# Patient Record
Sex: Female | Born: 1990 | Race: White | Hispanic: No | Marital: Single | Smoking: Former smoker
Health system: Southern US, Community
[De-identification: ages and names within clinical notes are randomized; demographics above are authoritative.]

## PROBLEM LIST (undated history)

## (undated) DIAGNOSIS — IMO0002 Reserved for concepts with insufficient information to code with codable children: Secondary | ICD-10-CM

## (undated) DIAGNOSIS — B009 Herpesviral infection, unspecified: Secondary | ICD-10-CM

## (undated) DIAGNOSIS — R87629 Unspecified abnormal cytological findings in specimens from vagina: Secondary | ICD-10-CM

## (undated) HISTORY — DX: Unspecified abnormal cytological findings in specimens from vagina: R87.629

## (undated) HISTORY — DX: Reserved for concepts with insufficient information to code with codable children: IMO0002

## (undated) HISTORY — PX: OTHER SURGICAL HISTORY: SHX169

## (undated) HISTORY — DX: Herpesviral infection, unspecified: B00.9

---
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
## 2008-05-23 ENCOUNTER — Emergency Department (HOSPITAL_COMMUNITY): Admission: EM | Admit: 2008-05-23 | Discharge: 2008-05-23 | Payer: Self-pay | Admitting: Emergency Medicine

## 2009-03-22 ENCOUNTER — Other Ambulatory Visit: Admission: RE | Admit: 2009-03-22 | Discharge: 2009-03-22 | Payer: Self-pay | Admitting: Obstetrics and Gynecology

## 2009-12-26 ENCOUNTER — Emergency Department (HOSPITAL_COMMUNITY): Admission: EM | Admit: 2009-12-26 | Discharge: 2009-12-27 | Payer: Self-pay | Admitting: Emergency Medicine

## 2010-04-13 ENCOUNTER — Emergency Department (HOSPITAL_COMMUNITY): Admission: EM | Admit: 2010-04-13 | Discharge: 2010-04-13 | Payer: Self-pay | Admitting: Emergency Medicine

## 2010-11-11 ENCOUNTER — Emergency Department (HOSPITAL_COMMUNITY)
Admission: EM | Admit: 2010-11-11 | Discharge: 2010-11-12 | Payer: Self-pay | Source: Home / Self Care | Admitting: Emergency Medicine

## 2011-02-01 LAB — DIFFERENTIAL
Basophils Absolute: 0.3 10*3/uL — ABNORMAL HIGH (ref 0.0–0.1)
Blasts: 0 %
Eosinophils Absolute: 0 10*3/uL (ref 0.0–0.7)
Lymphocytes Relative: 7 % — ABNORMAL LOW (ref 12–46)
Lymphs Abs: 1.9 10*3/uL (ref 0.7–4.0)
Monocytes Absolute: 0.8 10*3/uL (ref 0.1–1.0)
Neutro Abs: 24.1 10*3/uL — ABNORMAL HIGH (ref 1.7–7.7)
Neutrophils Relative %: 88 % — ABNORMAL HIGH (ref 43–77)

## 2011-02-01 LAB — URINALYSIS, ROUTINE W REFLEX MICROSCOPIC
Glucose, UA: NEGATIVE mg/dL
Hgb urine dipstick: NEGATIVE
Ketones, ur: NEGATIVE mg/dL
Protein, ur: NEGATIVE mg/dL
Specific Gravity, Urine: 1.015 (ref 1.005–1.030)
pH: 8.5 — ABNORMAL HIGH (ref 5.0–8.0)

## 2011-02-01 LAB — BASIC METABOLIC PANEL
CO2: 23 mEq/L (ref 19–32)
Calcium: 9.3 mg/dL (ref 8.4–10.5)
Creatinine, Ser: 0.76 mg/dL (ref 0.4–1.2)
GFR calc Af Amer: >60 mL/min (ref >60)
GFR calc non Af Amer: >60 mL/min (ref >60)
Glucose, Bld: 114 mg/dL — ABNORMAL HIGH (ref 70–99)
Potassium: 3.4 mEq/L — ABNORMAL LOW (ref 3.5–5.1)

## 2011-02-01 LAB — CBC
Hemoglobin: 12.3 g/dL (ref 12.0–15.0)
MCHC: 34.5 g/dL (ref 30.0–36.0)
WBC: 27.1 10*3/uL — ABNORMAL HIGH (ref 4.0–10.5)

## 2011-02-01 LAB — PREGNANCY, URINE: Preg Test, Ur: NEGATIVE

## 2011-08-23 LAB — URINALYSIS, ROUTINE W REFLEX MICROSCOPIC
Glucose, UA: NEGATIVE
Specific Gravity, Urine: 1.02

## 2011-08-23 LAB — CBC
HCT: 46.2
MCHC: 33.2
MCV: 86.5
Platelets: 264
RDW: 13.3

## 2011-08-23 LAB — BASIC METABOLIC PANEL
BUN: 11
GFR calc Af Amer: >60
GFR calc non Af Amer: >60
Glucose, Bld: 166 — ABNORMAL HIGH
Potassium: 4.4

## 2011-08-23 LAB — URINE MICROSCOPIC-ADD ON

## 2011-08-23 LAB — DIFFERENTIAL
Eosinophils Absolute: 0
Lymphs Abs: 0.6 — ABNORMAL LOW

## 2011-08-23 LAB — PREGNANCY, URINE: Preg Test, Ur: NEGATIVE

## 2011-08-23 IMAGING — CT CT ABD-PELV W/ CM
2 of 3 series · 16 of 46 positions shown, 18 images · IV contrast (Omnipaque 300)
Comparison: None.

CLINICAL DATA: Severe lower abdominal pain.

CT ABDOMEN AND PELVIS WITH CONTRAST
TECHNIQUE: Multidetector CT imaging of the abdomen and pelvis was
performed following the standard protocol during bolus
administration of intravenous contrast.
Contrast: 100 ml [id number]-PYY.

[Series 2: abd_pel_with 5.0 b40f · axial · 0.59mm/px · z∈[+326,+730]mm · 13 of 95 slices shown, 15 images]
[im 7/95  soft-tissue]
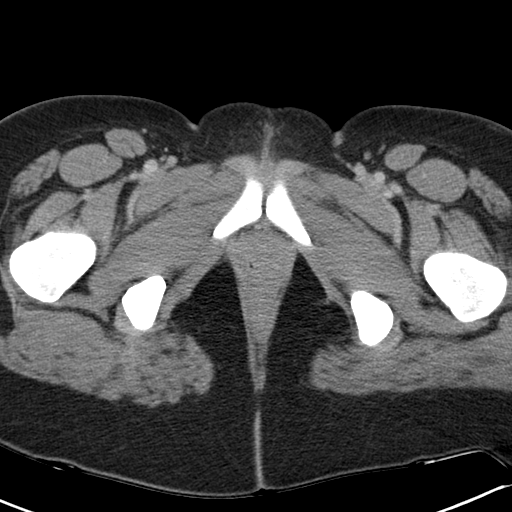
[im 7/95  bone]
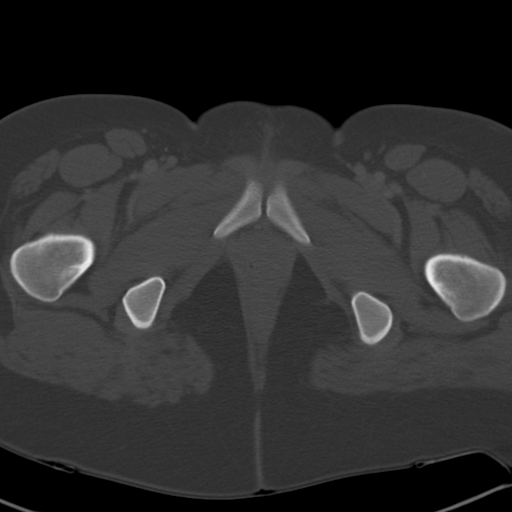
[im 13/95  soft-tissue]
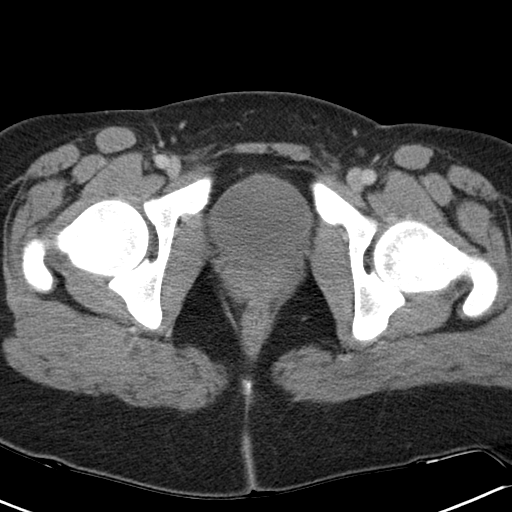
[im 19/95  soft-tissue]
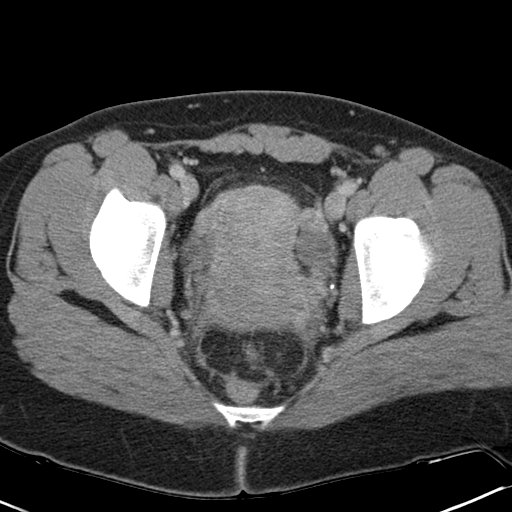
[im 28/95  soft-tissue]
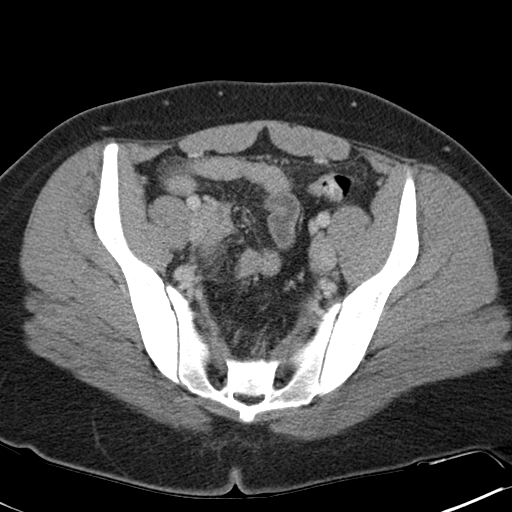
[im 34/95  soft-tissue]
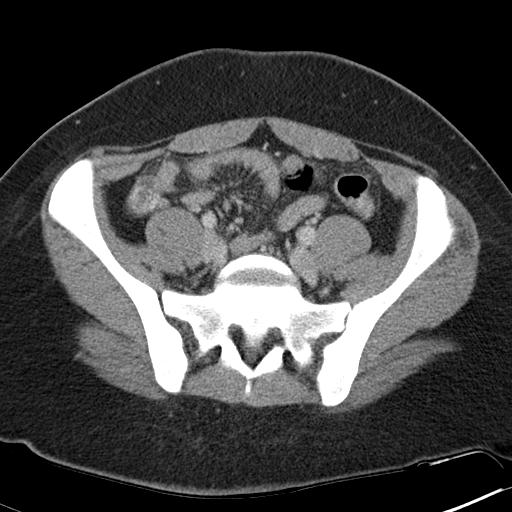
[im 40/95  soft-tissue]
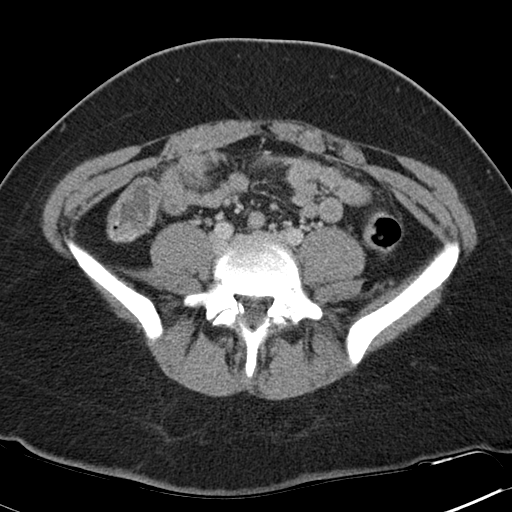
[im 49/95  soft-tissue]
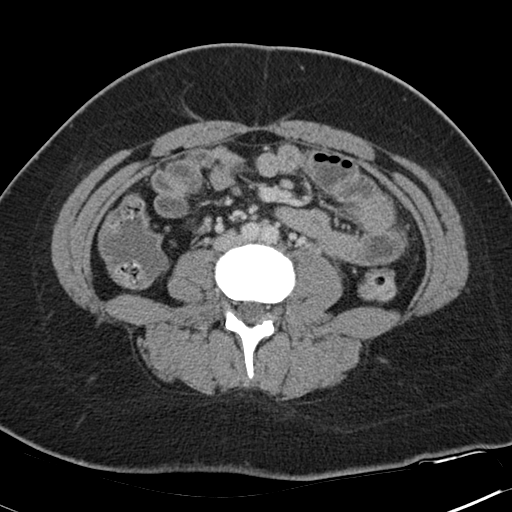
[im 55/95  soft-tissue]
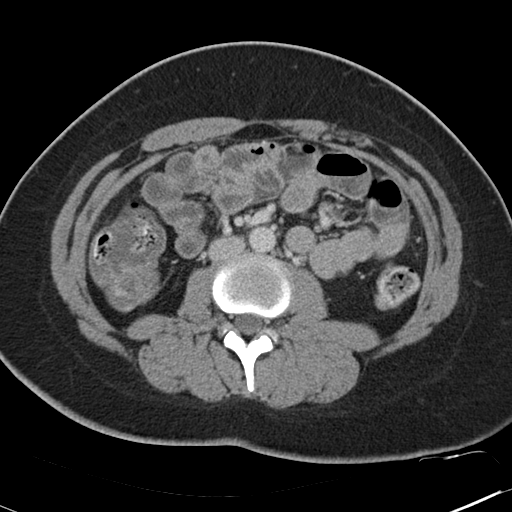
[im 61/95  soft-tissue]
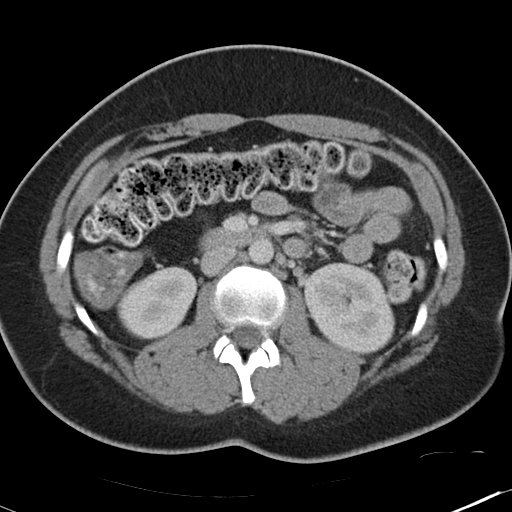
[im 61/95  bone]
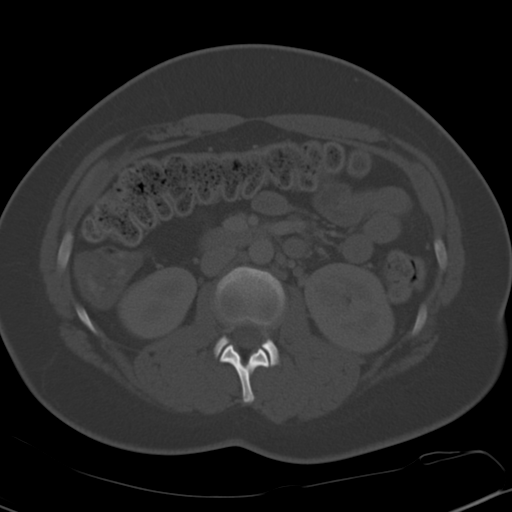
[im 67/95  soft-tissue]
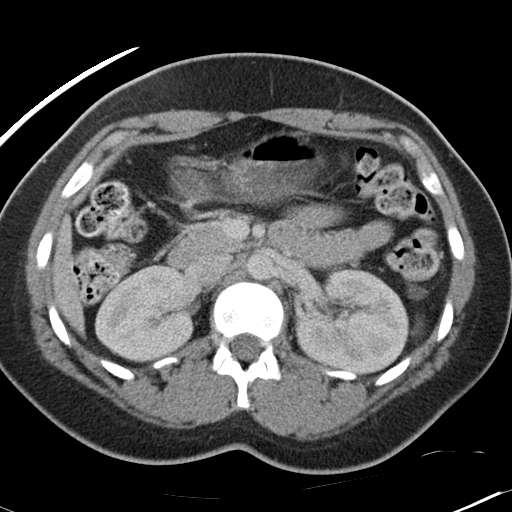
[im 76/95  soft-tissue]
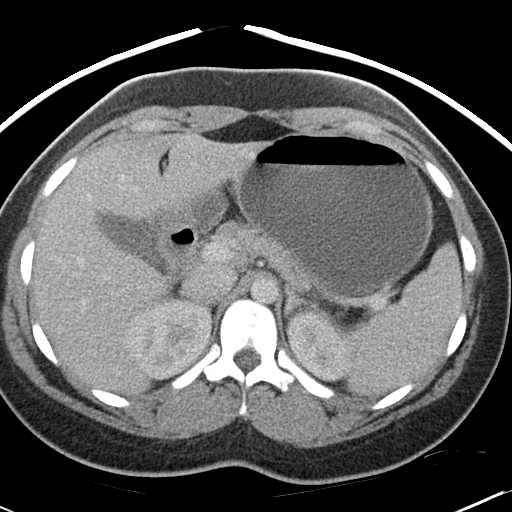
[im 82/95  soft-tissue]
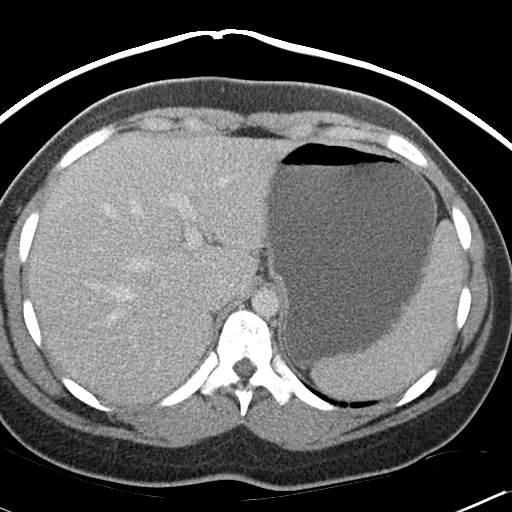
[im 88/95  soft-tissue]
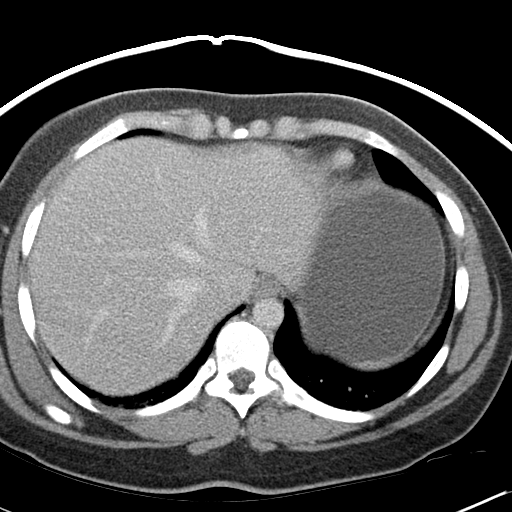

[Series 4: mpr cor post contrast (id) · coronal · 0.64mm/px · 3 of 96 slices shown]
[im 32/96  soft-tissue]
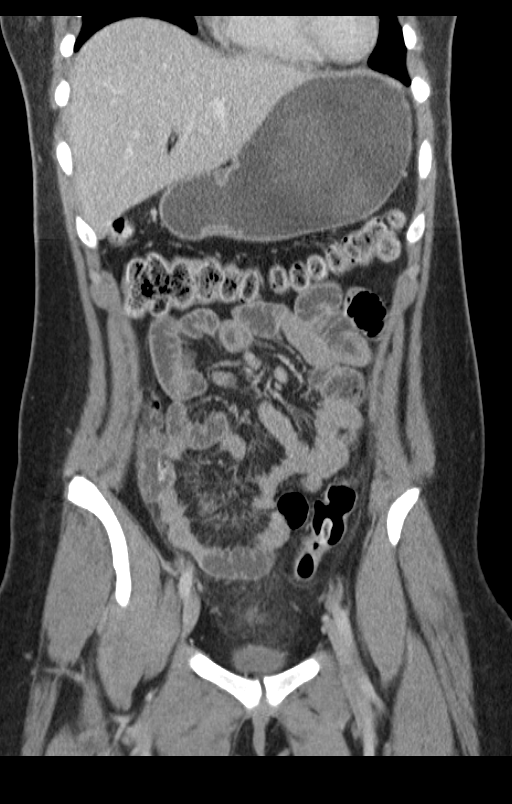
[im 43/96  soft-tissue]
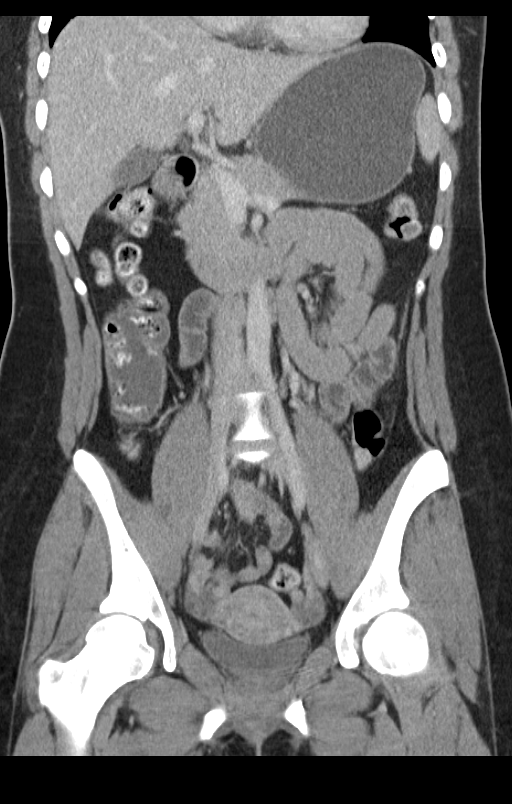
[im 53/96  soft-tissue]
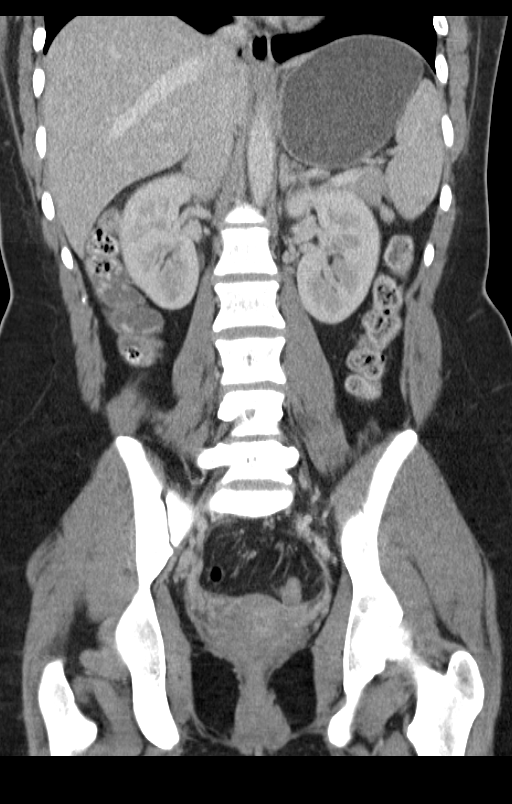

[16 of 46 positions shown; findings below may reference images not displayed]

FINDINGS: Lung bases show dependent atelectasis.  Liver and
gallbladder normal.  Common bile duct unremarkable.  Spleen normal.
Kidneys show normal enhancement.  Adrenal glands normal.  Stomach
and small bowel normal.  The stomach is distended with water.
Abdominal vasculature appears normal.

The appendix is unremarkable.  There is minimal fat stranding in
the anterior pelvic fat without cause identified.  This is
symmetric from left to right.  Uterus and adnexa appear within
normal limits.  Bilateral L5 pars defects with minimal grade 1
anterolisthesis of L5 on S1.  Colon and rectum appear within normal
limits.  Physiologic free fluid in the anatomic pelvis.
IMPRESSION: Minimal fat stranding in the anatomic pelvis without cause
identified.  This could be associated with enteric infection or
cystitis.

## 2011-08-24 ENCOUNTER — Emergency Department (HOSPITAL_COMMUNITY): Payer: Self-pay

## 2011-08-24 ENCOUNTER — Encounter: Payer: Self-pay | Admitting: Emergency Medicine

## 2011-08-24 ENCOUNTER — Emergency Department (HOSPITAL_COMMUNITY)
Admission: EM | Admit: 2011-08-24 | Discharge: 2011-08-24 | Disposition: A | Discharge status: Home / Self Care | Payer: Self-pay | Attending: Emergency Medicine | Admitting: Emergency Medicine

## 2011-08-24 DIAGNOSIS — B9789 Other viral agents as the cause of diseases classified elsewhere: Secondary | ICD-10-CM | POA: Insufficient documentation

## 2011-08-24 DIAGNOSIS — J45909 Unspecified asthma, uncomplicated: Secondary | ICD-10-CM | POA: Insufficient documentation

## 2011-08-24 DIAGNOSIS — B349 Viral infection, unspecified: Secondary | ICD-10-CM

## 2011-08-24 DIAGNOSIS — F17200 Nicotine dependence, unspecified, uncomplicated: Secondary | ICD-10-CM | POA: Insufficient documentation

## 2011-08-24 DIAGNOSIS — J40 Bronchitis, not specified as acute or chronic: Secondary | ICD-10-CM | POA: Insufficient documentation

## 2011-08-24 LAB — COMPREHENSIVE METABOLIC PANEL
Albumin: 3.5
Alkaline Phosphatase: 81
Chloride: 108
Creatinine, Ser: 0.7
GFR calc non Af Amer: >60
Potassium: 3.6

## 2011-08-24 LAB — POCT PREGNANCY, URINE: Preg Test, Ur: NEGATIVE

## 2011-08-24 MED ORDER — BENZONATATE 100 MG PO CAPS
200.0000 mg | ORAL_CAPSULE | Freq: Three times a day (TID) | ORAL | Status: DC
  Administered 2011-08-24: 200 mg via ORAL
  Filled 2011-08-24: qty 2

## 2011-08-24 MED ORDER — BENZONATATE 100 MG PO CAPS: 100.0000 mg | ORAL_CAPSULE | Freq: Three times a day (TID) | ORAL | Status: DC

## 2011-08-24 MED ORDER — BENZONATATE 100 MG PO CAPS: 100.0000 mg | ORAL_CAPSULE | Freq: Three times a day (TID) | ORAL | Status: AC

## 2011-08-24 NOTE — ED Provider Notes (Signed)
History    Scribed for [name], MD, the patient was seen in room APA05/APA05. This chart was scribed by Katha Cabal. This patient's care was started at 3:28 PM.     CSN: [id number] Arrival date & time: 08/24/2011  2:41 PM  Chief Complaint  Patient presents with  . Asthma  . Cough    (Consider location/radiation/quality/duration/timing/severity/associated sxs/prior treatment) HPI  ROSHANNA Henry is a 20 y.o. female with hx of asthma who presents to the Emergency Department complaining of sudden onset of SOB that began today with associated dry persistent cough, fever (undocumented), rhinorrhea and nasal congestion.  Denies nausea, vomiting and chills.   Patient does not use inhaler for asthma.  Patient adds that her boyfriend has similar sx.   Patient is a smoker.     PAST MEDICAL HISTORY:  Past Medical History  Diagnosis Date  . Asthma     PAST SURGICAL HISTORY:  History reviewed. No pertinent past surgical history.  FAMILY HISTORY:  No family history on file.   SOCIAL HISTORY: History   Social History  . Marital Status: Single    Spouse Name: N/A    Number of Children: N/A  . Years of Education: N/A   Social History Main Topics  . Smoking status: Current Everyday Smoker -- 0.5 packs/day    Types: Cigarettes  . Smokeless tobacco: None  . Alcohol Use: No  . Drug Use: No  . Sexually Active: Yes   Other Topics Concern  . None   Social History Narrative  . None    Review of Systems 10 Systems reviewed and are negative for acute change except as noted in the HPI.  Allergies  Penicillins  Home Medications   Current Outpatient Rx  Name Route Sig Dispense Refill  . SINUS CONGESTION/PAIN SEVERE PO Oral Take 2 tablets by mouth once as needed. For sinus congestion relief     . IMPLANON [place] Subcutaneous Inject 1 each into the skin once.        BP 114/66  Pulse 93  Temp(Src) 99.6 F (37.6 C) (Oral)  Resp 26  Ht 5\' 4"  (1.626 m)  Wt 175 lb  (79.379 kg)  BMI 30.04 kg/m2  SpO2 100%  LMP 07/25/2011  Physical Exam  Constitutional: She is oriented to person, place, and time. She appears well-developed and well-nourished.  HENT:  Head: Normocephalic and atraumatic.  Nose: Right sinus exhibits no maxillary sinus tenderness and no frontal sinus tenderness. Left sinus exhibits no maxillary sinus tenderness and no frontal sinus tenderness.  Mouth/Throat: Uvula is midline, oropharynx is clear and moist and mucous membranes are normal.  Eyes: EOM are normal.  Neck: Neck supple.  Cardiovascular: Normal rate, regular rhythm, normal heart sounds and intact distal pulses.   Pulmonary/Chest: Effort normal and breath sounds normal. No respiratory distress. She has no wheezes. She has no rales.       Clear and equal bilaterally   Abdominal: Soft. There is no tenderness.  Musculoskeletal: Normal range of motion.  Neurological: She is alert and oriented to person, place, and time.  Skin: Skin is warm. No rash noted.  Psychiatric: She has a normal mood and affect. Her behavior is normal.    ED Course  Procedures (including critical care time)   OTHER DATA REVIEWED: Nursing notes, vital signs, and past medical records reviewed.   DIAGNOSTIC STUDIES: Oxygen Saturation is 100% on room air, normal by my interpretation.       LABS / RADIOLOGY:  Labs Reviewed  POCT PREGNANCY, URINE  POCT PREGNANCY, URINE   Dg Chest 2 View  08/24/2011  *RADIOLOGY REPORT*  Clinical Data: Cough, shortness of breath, asthma, smoker  CHEST - 2 VIEW  Comparison: 12/26/2009  Findings: Normal heart size, mediastinal contours, and pulmonary vascularity. Lungs clear. No pleural effusion or pneumothorax. Bones unremarkable.  IMPRESSION: Normal exam.  Original Report Authenticated By: Lollie Marrow, M.D.     ED COURSE / COORDINATION OF CARE: 3:35 Physical exam complete.  Will order CXR.    Orders Placed This Encounter  Procedures  . DG Chest 2 View  .  Pregnancy, urine POC      MEDICATIONS GIVEN IN THE E.D. Scheduled Meds:   Continuous Infusions:       Marland Kitchen  MDM  MDM:  Asthma exacerbation, versus viral illness.  Bronchitis versus pneumonia IMPRESSION: Viral infection No respiratory distress hypoxia or signs of toxicity.     I personally performed the services described in this documentation, which was scribed in my presence. The recorded information has been reviewed and considered.          [name], MD 08/24/11 1756

## 2011-08-24 NOTE — ED Notes (Signed)
Pt taken to xray now. u preg negative

## 2011-08-24 NOTE — ED Notes (Signed)
Pt states having coughing and SOB starting today. ptstates has history of asthma

## 2011-08-29 LAB — URINALYSIS, ROUTINE W REFLEX MICROSCOPIC
Glucose, UA: NEGATIVE
Nitrite: NEGATIVE
Protein, ur: NEGATIVE
Urobilinogen, UA: 0.2

## 2011-08-29 LAB — PREGNANCY, URINE: Preg Test, Ur: NEGATIVE

## 2011-09-12 ENCOUNTER — Other Ambulatory Visit: Payer: Self-pay | Admitting: Adult Health

## 2011-09-12 ENCOUNTER — Other Ambulatory Visit (HOSPITAL_COMMUNITY)
Admission: RE | Admit: 2011-09-12 | Discharge: 2011-09-12 | Disposition: A | Discharge status: Home / Self Care | Payer: Medicaid Other | Source: Ambulatory Visit | Attending: Obstetrics and Gynecology | Admitting: Obstetrics and Gynecology

## 2011-09-12 DIAGNOSIS — Z113 Encounter for screening for infections with a predominantly sexual mode of transmission: Secondary | ICD-10-CM | POA: Insufficient documentation

## 2011-09-12 DIAGNOSIS — Z01419 Encounter for gynecological examination (general) (routine) without abnormal findings: Secondary | ICD-10-CM | POA: Insufficient documentation

## 2012-09-23 ENCOUNTER — Other Ambulatory Visit (HOSPITAL_COMMUNITY)
Admission: RE | Admit: 2012-09-23 | Discharge: 2012-09-23 | Disposition: A | Discharge status: Home / Self Care | Payer: Medicaid Other | Source: Ambulatory Visit | Attending: Obstetrics and Gynecology | Admitting: Obstetrics and Gynecology

## 2012-09-23 ENCOUNTER — Other Ambulatory Visit: Payer: Self-pay | Admitting: Adult Health

## 2012-09-23 DIAGNOSIS — Z113 Encounter for screening for infections with a predominantly sexual mode of transmission: Secondary | ICD-10-CM | POA: Insufficient documentation

## 2012-09-23 DIAGNOSIS — Z01419 Encounter for gynecological examination (general) (routine) without abnormal findings: Secondary | ICD-10-CM | POA: Insufficient documentation

## 2012-11-21 ENCOUNTER — Encounter (HOSPITAL_COMMUNITY): Payer: Self-pay | Admitting: *Deleted

## 2012-11-21 ENCOUNTER — Emergency Department (HOSPITAL_COMMUNITY)
Admission: EM | Admit: 2012-11-21 | Discharge: 2012-11-22 | Disposition: A | Discharge status: Home / Self Care | Payer: Medicaid Other | Attending: Emergency Medicine | Admitting: Emergency Medicine

## 2012-11-21 DIAGNOSIS — R05 Cough: Secondary | ICD-10-CM

## 2012-11-21 DIAGNOSIS — J3489 Other specified disorders of nose and nasal sinuses: Secondary | ICD-10-CM | POA: Insufficient documentation

## 2012-11-21 DIAGNOSIS — J45909 Unspecified asthma, uncomplicated: Secondary | ICD-10-CM | POA: Insufficient documentation

## 2012-11-21 DIAGNOSIS — R059 Cough, unspecified: Secondary | ICD-10-CM | POA: Insufficient documentation

## 2012-11-21 DIAGNOSIS — IMO0002 Reserved for concepts with insufficient information to code with codable children: Secondary | ICD-10-CM | POA: Insufficient documentation

## 2012-11-21 DIAGNOSIS — R509 Fever, unspecified: Secondary | ICD-10-CM | POA: Insufficient documentation

## 2012-11-21 DIAGNOSIS — F17200 Nicotine dependence, unspecified, uncomplicated: Secondary | ICD-10-CM | POA: Insufficient documentation

## 2012-11-21 DIAGNOSIS — H6692 Otitis media, unspecified, left ear: Secondary | ICD-10-CM

## 2012-11-21 DIAGNOSIS — H6690 Otitis media, unspecified, unspecified ear: Secondary | ICD-10-CM | POA: Insufficient documentation

## 2012-11-21 DIAGNOSIS — H9209 Otalgia, unspecified ear: Secondary | ICD-10-CM | POA: Insufficient documentation

## 2012-11-21 DIAGNOSIS — J029 Acute pharyngitis, unspecified: Secondary | ICD-10-CM

## 2012-11-21 MED ORDER — CEFTRIAXONE SODIUM 1 G IJ SOLR
1.0000 g | Freq: Once | INTRAMUSCULAR | Status: AC
  Administered 2012-11-22: 1 g via INTRAMUSCULAR
  Filled 2012-11-21: qty 10

## 2012-11-21 MED ORDER — AZITHROMYCIN 250 MG PO TABS
500.0000 mg | ORAL_TABLET | Freq: Once | ORAL | Status: AC
  Administered 2012-11-22: 500 mg via ORAL
  Filled 2012-11-21: qty 2

## 2012-11-21 MED ORDER — HYDROCOD POLST-CHLORPHEN POLST 10-8 MG/5ML PO LQCR
5.0000 mL | Freq: Once | ORAL | Status: AC
  Administered 2012-11-22: 5 mL via ORAL
  Filled 2012-11-21: qty 5

## 2012-11-21 NOTE — ED Notes (Signed)
Cough, fever, nasal congestion for 1 week.  Nausea.

## 2012-11-21 NOTE — ED Provider Notes (Signed)
History     CSN: [id number]  Arrival date & time 11/21/12  2233   First MD Initiated Contact with Patient 11/21/12 2328      Chief Complaint  Patient presents with  . Cough    (Consider location/radiation/quality/duration/timing/severity/associated sxs/prior treatment) HPI Comments: Cough is NP.  Pt prefers not to get a CXR b/c of cost.  She is in process of getting insurance.  Patient is a 22 y.o. female presenting with cough. The history is provided by the patient. No language interpreter was used.  Cough This is a new problem. Episode onset: ~ 12 days ago. The problem occurs constantly. The problem has not changed since onset.The maximum temperature recorded prior to her arrival was 100 to 100.9 F. Associated symptoms include ear congestion, ear pain, headaches, rhinorrhea and sore throat. Pertinent negatives include no chills. She has tried nothing for the symptoms. She is a smoker. Her past medical history is significant for asthma.    Past Medical History  Diagnosis Date  . Asthma     History reviewed. No pertinent past surgical history.  History reviewed. No pertinent family history.  History  Substance Use Topics  . Smoking status: Current Every Day Smoker -- 0.5 packs/day    Types: Cigarettes  . Smokeless tobacco: Not on file  . Alcohol Use: No    OB History    Grav Para Term Preterm Abortions TAB SAB Ect Mult Living                  Review of Systems  Constitutional: Positive for fever. Negative for chills.  HENT: Positive for ear pain, sore throat and rhinorrhea.   Respiratory: Positive for cough.   Neurological: Positive for headaches.  All other systems reviewed and are negative.    Allergies  Penicillins  Home Medications   Current Outpatient Rx  Name  Route  Sig  Dispense  Refill  . AZITHROMYCIN 250 MG PO TABS      One tab po QD (intial dose given in the ED)   4 tablet   0   . IMPLANON [place]   Subcutaneous   Inject 1 each into the skin  once.           Marland Kitchen FLUTICASONE PROPIONATE 50 MCG/ACT NA SUSP   Nasal   Place 2 sprays into the nose daily.   16 g   0   . GUAIFENESIN-CODEINE 100-10 MG/5ML PO SYRP      10 mls po q 4-6 hrs prn cough   240 mL   0   . SINUS CONGESTION/PAIN SEVERE PO   Oral   Take 2 tablets by mouth once as needed. For sinus congestion relief            BP 121/89  Pulse 76  Temp 98.4 F (36.9 C) (Oral)  Resp 24  Ht 5' 4.5" (1.638 m)  Wt 175 lb (79.379 kg)  BMI 29.57 kg/m2  SpO2 100%  LMP 10/21/2012  Physical Exam  Nursing note and vitals reviewed. Constitutional: She is oriented to person, place, and time. She appears well-developed and well-nourished. No distress.  HENT:  Head: Normocephalic and atraumatic.  Right Ear: Hearing, tympanic membrane, external ear and ear canal normal.  Left Ear: External ear and ear canal normal. Tympanic membrane is erythematous and bulging. A middle ear effusion is present.  Mouth/Throat: Uvula is midline and mucous membranes are normal. Posterior oropharyngeal erythema present. No oropharyngeal exudate, posterior oropharyngeal edema or tonsillar abscesses.  Eyes: EOM are normal.  Neck: Normal range of motion.  Cardiovascular: Normal rate and regular rhythm.   Pulmonary/Chest: Effort normal and breath sounds normal. No accessory muscle usage. Not tachypneic. No respiratory distress. She has no decreased breath sounds. She has no wheezes. She has no rhonchi. She has no rales. She exhibits no tenderness.  Abdominal: Soft. She exhibits no distension. There is no tenderness.  Musculoskeletal: Normal range of motion.  Neurological: She is alert and oriented to person, place, and time.  Skin: Skin is warm and dry.  Psychiatric: She has a normal mood and affect. Judgment normal.    ED Course  Procedures (including critical care time)  Labs Reviewed - No data to display No results found.   1. Left otitis media   2. Pharyngitis   3. Cough        MDM  rx-zithromax 250 mg x 4 rx-flonase spray rx-robitussin AC, 240 ml  Rocephin 1 gm IM and zithromax 500 mg po given in ED.        Evalina Field, PA 11/22/12 0010  Evalina Field, PA 11/22/12 0010

## 2012-11-22 MED ORDER — AZITHROMYCIN 250 MG PO TABS: ORAL_TABLET | ORAL | Status: DC

## 2012-11-22 MED ORDER — GUAIFENESIN-CODEINE 100-10 MG/5ML PO SYRP: ORAL_SOLUTION | ORAL | Status: DC

## 2012-11-22 MED ORDER — LIDOCAINE HCL (PF) 1 % IJ SOLN
INTRAMUSCULAR | Status: AC
  Administered 2012-11-22: 01:00:00
  Filled 2012-11-22: qty 5

## 2012-11-22 MED ORDER — FLUTICASONE PROPIONATE 50 MCG/ACT NA SUSP: 2.0000 | Freq: Every day | NASAL | Status: DC

## 2012-11-22 NOTE — ED Notes (Signed)
Discharge instructions reviewed with pt, questions answered. Pt verbalized understanding.  

## 2012-11-23 NOTE — ED Provider Notes (Signed)
Medical screening examination/treatment/procedure(s) were performed by non-physician practitioner and as supervising physician I was immediately available for consultation/collaboration.   Joya Gaskins, MD 11/23/12 [phone]

## 2013-03-28 ENCOUNTER — Encounter (HOSPITAL_COMMUNITY): Payer: Self-pay | Admitting: *Deleted

## 2013-03-28 ENCOUNTER — Emergency Department (HOSPITAL_COMMUNITY): Payer: Self-pay

## 2013-03-28 ENCOUNTER — Emergency Department (HOSPITAL_COMMUNITY)
Admission: EM | Admit: 2013-03-28 | Discharge: 2013-03-28 | Disposition: A | Discharge status: Home / Self Care | Payer: Self-pay | Attending: Emergency Medicine | Admitting: Emergency Medicine

## 2013-03-28 DIAGNOSIS — Z3202 Encounter for pregnancy test, result negative: Secondary | ICD-10-CM | POA: Insufficient documentation

## 2013-03-28 DIAGNOSIS — J45909 Unspecified asthma, uncomplicated: Secondary | ICD-10-CM | POA: Insufficient documentation

## 2013-03-28 DIAGNOSIS — Z79899 Other long term (current) drug therapy: Secondary | ICD-10-CM | POA: Insufficient documentation

## 2013-03-28 DIAGNOSIS — R110 Nausea: Secondary | ICD-10-CM | POA: Insufficient documentation

## 2013-03-28 DIAGNOSIS — Z880 Allergy status to penicillin: Secondary | ICD-10-CM | POA: Insufficient documentation

## 2013-03-28 DIAGNOSIS — IMO0002 Reserved for concepts with insufficient information to code with codable children: Secondary | ICD-10-CM | POA: Insufficient documentation

## 2013-03-28 DIAGNOSIS — R109 Unspecified abdominal pain: Secondary | ICD-10-CM | POA: Insufficient documentation

## 2013-03-28 DIAGNOSIS — F17200 Nicotine dependence, unspecified, uncomplicated: Secondary | ICD-10-CM | POA: Insufficient documentation

## 2013-03-28 LAB — CBC WITH DIFFERENTIAL/PLATELET
Eosinophils Absolute: 0.1 10*3/uL (ref 0.0–0.7)
Eosinophils Relative: 1 % (ref 0–5)
Hemoglobin: 13 g/dL (ref 12.0–15.0)
Lymphocytes Relative: 7 % — ABNORMAL LOW (ref 12–46)
Lymphs Abs: 0.8 10*3/uL (ref 0.7–4.0)
MCH: 30.7 pg (ref 26.0–34.0)
MCV: 90.6 fL (ref 78.0–100.0)
Monocytes Relative: 5 % (ref 3–12)
RBC: 4.24 MIL/uL (ref 3.87–5.11)

## 2013-03-28 LAB — COMPREHENSIVE METABOLIC PANEL
AST: 14 U/L (ref 0–37)
Albumin: 4.1 g/dL (ref 3.5–5.2)
Calcium: 9.2 mg/dL (ref 8.4–10.5)
Creatinine, Ser: 0.66 mg/dL (ref 0.50–1.10)
Total Protein: 7.3 g/dL (ref 6.0–8.3)

## 2013-03-28 LAB — URINALYSIS, ROUTINE W REFLEX MICROSCOPIC
Glucose, UA: NEGATIVE mg/dL
Ketones, ur: 40 mg/dL — AB
Protein, ur: NEGATIVE mg/dL
pH: 6 (ref 5.0–8.0)

## 2013-03-28 LAB — LIPASE, BLOOD: Lipase: 20 U/L (ref 11–59)

## 2013-03-28 LAB — URINE MICROSCOPIC-ADD ON

## 2013-03-28 LAB — AMYLASE: Amylase: 28 U/L (ref 0–105)

## 2013-03-28 LAB — POCT PREGNANCY, URINE: Preg Test, Ur: NEGATIVE

## 2013-03-28 LAB — WET PREP, GENITAL: Clue Cells Wet Prep HPF POC: NONE SEEN

## 2013-03-28 MED ORDER — PROMETHAZINE HCL 25 MG PO TABS: 25.0000 mg | ORAL_TABLET | Freq: Four times a day (QID) | ORAL | Status: DC | PRN

## 2013-03-28 MED ORDER — OXYCODONE-ACETAMINOPHEN 5-325 MG PO TABS: 1.0000 | ORAL_TABLET | Freq: Four times a day (QID) | ORAL | Status: DC | PRN

## 2013-03-28 MED ORDER — OXYCODONE-ACETAMINOPHEN 5-325 MG PO TABS
2.0000 | ORAL_TABLET | Freq: Once | ORAL | Status: AC
  Administered 2013-03-28: 2 via ORAL
  Filled 2013-03-28: qty 2

## 2013-03-28 MED ORDER — ONDANSETRON 4 MG PO TBDP
4.0000 mg | ORAL_TABLET | Freq: Once | ORAL | Status: AC
  Administered 2013-03-28: 4 mg via ORAL
  Filled 2013-03-28: qty 1

## 2013-03-28 NOTE — ED Provider Notes (Signed)
History    This chart was scribed for Angelica B. Bernette Mayers, MD by Toya Smothers, ED Scribe. The patient was seen in room APA11/APA11. Patient's care was started at 1216.  CSN: [id number]  Arrival date & time 03/28/13  1216   None     Chief Complaint  Patient presents with  . Abdominal Pain    Patient is a 22 y.o. female presenting with abdominal pain. The history is provided by the patient. No language interpreter was used.  Abdominal Pain Associated symptoms: nausea     HPI Comments:  Angelica Henry is a 23 y.o. female who presents to the Emergency Department complaining of 2 weeks of intermittent, abdominal pain without aggravation. Pain is dull, non-radiating, worse at night, and improved with pressure. She endorses nausea without emesis, and reports no change when eating. Pt had short menses last week No bleeding now. Pt reports regular bowel movements. Symptoms have not been treated PTA. Pt denies vaginal bleeding, vaginal discharge, hematuria, fever, chills, diarrhea, weakness, cough, SOB and any other pain. Pt is a current everyday smoker, denying alcohol and illicit drug use. LKMP 03/20/13. Per Pt, there is no chance that she could be pregnant.    Past Medical History  Diagnosis Date  . Asthma     History reviewed. No pertinent past surgical history.  History reviewed. No pertinent family history.  History  Substance Use Topics  . Smoking status: Current Every Day Smoker -- 0.50 packs/day    Types: Cigarettes  . Smokeless tobacco: Not on file  . Alcohol Use: No   Review of Systems  Gastrointestinal: Positive for nausea and abdominal pain.  All other systems reviewed and are negative.    Allergies  Penicillins  Home Medications   Current Outpatient Rx  Name  Route  Sig  Dispense  Refill  . azithromycin (ZITHROMAX) 250 MG tablet      One tab po QD (intial dose given in the ED)   4 tablet   0   . Etonogestrel (IMPLANON [place])   Subcutaneous   Inject 1 each into  the skin once.           . fluticasone (FLONASE) 50 MCG/ACT nasal spray   Nasal   Place 2 sprays into the nose daily.   16 g   0   . guaiFENesin-codeine (ROBITUSSIN AC) 100-10 MG/5ML syrup      10 mls po q 4-6 hrs prn cough   240 mL   0   . Phenylephrine-APAP-Guaifenesin (SINUS CONGESTION/PAIN SEVERE PO)   Oral   Take 2 tablets by mouth once as needed. For sinus congestion relief            BP 109/54  Pulse 82  Temp(Src) 98.6 F (37 C) (Oral)  Resp 20  Ht 5\' 4"  (1.626 m)  Wt 180 lb (81.647 kg)  BMI 30.88 kg/m2  SpO2 100%  LMP 03/21/2013  Physical Exam  Nursing note and vitals reviewed. Constitutional: She is oriented to person, place, and time. She appears well-developed and well-nourished.  HENT:  Head: Normocephalic and atraumatic.  Eyes: EOM are normal. Pupils are equal, round, and reactive to light.  Neck: Normal range of motion. Neck supple.  Cardiovascular: Normal rate, normal heart sounds and intact distal pulses.   Pulmonary/Chest: Effort normal and breath sounds normal.  Abdominal: Bowel sounds are normal. She exhibits no distension. There is no tenderness.  Genitourinary: Cervix exhibits motion tenderness. Cervix exhibits no discharge and no friability. Right adnexum  displays tenderness (mild). Right adnexum displays no mass. Left adnexum displays no mass and no tenderness. No bleeding around the vagina. No vaginal discharge found.  Musculoskeletal: Normal range of motion. She exhibits no edema and no tenderness.  Neurological: She is alert and oriented to person, place, and time. She has normal strength. No cranial nerve deficit or sensory deficit.  Skin: Skin is warm and dry. No rash noted.  Psychiatric: She has a normal mood and affect.    ED Course  Procedures DIAGNOSTIC STUDIES: Oxygen Saturation is 100% on room air, normal by my interpretation.    COORDINATION OF CARE: 15:40- Evaluated Pt. Pt is awake, alert, and without distress. Pt appears  to be in pain. 15:44- Patient / Family / Caregiver understand and agree with initial ED impression and plan with expectations set for ED visit.     Labs Reviewed  WET PREP, GENITAL - Abnormal; Notable for the following:    WBC, Wet Prep HPF POC FEW (*)    All other components within normal limits  CBC WITH DIFFERENTIAL - Abnormal; Notable for the following:    Neutrophils Relative % 86 (*)    Neutro Abs 8.8 (*)    Lymphocytes Relative 7 (*)    All other components within normal limits  URINALYSIS, ROUTINE W REFLEX MICROSCOPIC - Abnormal; Notable for the following:    Specific Gravity, Urine >1.030 (*)    Hgb urine dipstick TRACE (*)    Ketones, ur 40 (*)    All other components within normal limits  GC/CHLAMYDIA PROBE AMP  AMYLASE  COMPREHENSIVE METABOLIC PANEL  URINE MICROSCOPIC-ADD ON  LIPASE, BLOOD  POCT PREGNANCY, URINE   US Transvaginal Non-ob  03/28/2013   *RADIOLOGY REPORT*  Clinical Data: Pelvic pain.  TRANSABDOMINAL AND TRANSVAGINAL ULTRASOUND OF PELVIS Technique:  Both transabdominal and transvaginal ultrasound examinations of the pelvis were performed. Transabdominal technique was performed for global imaging of the pelvis including uterus, ovaries, adnexal regions, and pelvic cul-de-sac.  It was necessary to proceed with endovaginal exam following the transabdominal exam to visualize the uterus, ovaries, and adnexa  .  Comparison:  12/27/2009 CT  Findings:  Uterus: 6.5 x 3.2 x 4.2 cm. Normal in morphology.  Endometrium: Normal, 3 mm.  Right ovary:  Normal appearance/no adnexal mass  A cylindrical structure in the right pelvis on image 73 is favored to represent bowel, adjacent to the ovary.  Left ovary: Normal appearance/no adnexal mass  Other findings: No free fluid  IMPRESSION: Normal pelvic ultrasound for age.  No explanation for pain.  A cylindrical structure adjacent the right ovary is favored to represent bowel.  If there is a clinical concern of hydrosalpinx (which is  felt less likely), consider CT or MRI.   Original Report Authenticated By: Jeronimo Greaves, M.D.   US Pelvis Complete  03/28/2013   *RADIOLOGY REPORT*  Clinical Data: Pelvic pain.  TRANSABDOMINAL AND TRANSVAGINAL ULTRASOUND OF PELVIS Technique:  Both transabdominal and transvaginal ultrasound examinations of the pelvis were performed. Transabdominal technique was performed for global imaging of the pelvis including uterus, ovaries, adnexal regions, and pelvic cul-de-sac.  It was necessary to proceed with endovaginal exam following the transabdominal exam to visualize the uterus, ovaries, and adnexa  .  Comparison:  12/27/2009 CT  Findings:  Uterus: 6.5 x 3.2 x 4.2 cm. Normal in morphology.  Endometrium: Normal, 3 mm.  Right ovary:  Normal appearance/no adnexal mass  A cylindrical structure in the right pelvis on image 73 is favored to represent  bowel, adjacent to the ovary.  Left ovary: Normal appearance/no adnexal mass  Other findings: No free fluid  IMPRESSION: Normal pelvic ultrasound for age.  No explanation for pain.  A cylindrical structure adjacent the right ovary is favored to represent bowel.  If there is a clinical concern of hydrosalpinx (which is felt less likely), consider CT or MRI.   Original Report Authenticated By: Jeronimo Greaves, M.D.     1. Abdominal pain       MDM  Labs and ultrasound unremarkable as above, agree with radiologist; Doubt she has hydrosalpinx, pain improved. Advised follow up with Gyn for further eval if symptoms persist.       I personally performed the services described in this documentation, which was scribed in my presence. The recorded information has been reviewed and is accurate.     Angelica B. Bernette Mayers, MD 03/28/13 1723

## 2013-03-28 NOTE — ED Notes (Signed)
abd pain for 2 weeks,nausea, no vomiting, no diarrhea

## 2013-10-15 ENCOUNTER — Encounter (HOSPITAL_COMMUNITY): Payer: Self-pay | Admitting: Emergency Medicine

## 2013-10-15 ENCOUNTER — Emergency Department (HOSPITAL_COMMUNITY)
Admission: EM | Admit: 2013-10-15 | Discharge: 2013-10-15 | Disposition: A | Discharge status: Home / Self Care | Payer: Self-pay | Attending: Emergency Medicine | Admitting: Emergency Medicine

## 2013-10-15 DIAGNOSIS — Y929 Unspecified place or not applicable: Secondary | ICD-10-CM | POA: Insufficient documentation

## 2013-10-15 DIAGNOSIS — S335XXA Sprain of ligaments of lumbar spine, initial encounter: Secondary | ICD-10-CM | POA: Insufficient documentation

## 2013-10-15 DIAGNOSIS — S39012A Strain of muscle, fascia and tendon of lower back, initial encounter: Secondary | ICD-10-CM

## 2013-10-15 DIAGNOSIS — Y939 Activity, unspecified: Secondary | ICD-10-CM | POA: Insufficient documentation

## 2013-10-15 DIAGNOSIS — Z880 Allergy status to penicillin: Secondary | ICD-10-CM | POA: Insufficient documentation

## 2013-10-15 DIAGNOSIS — X58XXXA Exposure to other specified factors, initial encounter: Secondary | ICD-10-CM | POA: Insufficient documentation

## 2013-10-15 DIAGNOSIS — F17200 Nicotine dependence, unspecified, uncomplicated: Secondary | ICD-10-CM | POA: Insufficient documentation

## 2013-10-15 DIAGNOSIS — J45901 Unspecified asthma with (acute) exacerbation: Secondary | ICD-10-CM | POA: Insufficient documentation

## 2013-10-15 MED ORDER — DICLOFENAC SODIUM 75 MG PO TBEC: 75.0000 mg | DELAYED_RELEASE_TABLET | Freq: Two times a day (BID) | ORAL | Status: DC

## 2013-10-15 MED ORDER — DIAZEPAM 5 MG PO TABS: ORAL_TABLET | ORAL | Status: DC

## 2013-10-15 MED ORDER — DIAZEPAM 5 MG PO TABS: 5.0000 mg | ORAL_TABLET | Freq: Two times a day (BID) | ORAL | Status: DC

## 2013-10-15 MED ORDER — DEXAMETHASONE 4 MG PO TABS: ORAL_TABLET | ORAL | Status: DC

## 2013-10-15 NOTE — ED Provider Notes (Signed)
CSN: [id number]     Arrival date & time 10/15/13  1618 History   First MD Initiated Contact with Patient 10/15/13 1635     Chief Complaint  Patient presents with  . Back Pain   (Consider location/radiation/quality/duration/timing/severity/associated sxs/prior Treatment) Patient is a 22 y.o. female presenting with back pain. The history is provided by the patient.  Back Pain Location:  Lumbar spine Quality: sharpe. Radiates to:  R thigh Pain severity:  Moderate Pain is:  Same all the time Onset quality:  Gradual Duration:  1 day Timing:  Intermittent Progression:  Worsening Chronicity:  New Context: twisting   Context: not falling, not jumping from heights and not lifting heavy objects   Relieved by:  Nothing Worsened by:  Twisting Ineffective treatments:  None tried Associated symptoms: no abdominal pain, no bladder incontinence, no bowel incontinence, no chest pain, no dysuria and no weakness     Past Medical History  Diagnosis Date  . Asthma    History reviewed. No pertinent past surgical history. History reviewed. No pertinent family history. History  Substance Use Topics  . Smoking status: Current Every Day Smoker -- 0.50 packs/day    Types: Cigarettes  . Smokeless tobacco: Not on file  . Alcohol Use: No   OB History   Grav Para Term Preterm Abortions TAB SAB Ect Mult Living                 Review of Systems  Constitutional: Negative for activity change.       All ROS Neg except as noted in HPI  HENT: Positive for congestion. Negative for nosebleeds.   Eyes: Negative for photophobia and discharge.  Respiratory: Positive for wheezing. Negative for cough and shortness of breath.   Cardiovascular: Negative for chest pain and palpitations.  Gastrointestinal: Negative for abdominal pain, blood in stool and bowel incontinence.  Genitourinary: Negative for bladder incontinence, dysuria, frequency and hematuria.  Musculoskeletal: Positive for back pain. Negative for  arthralgias and neck pain.  Skin: Negative.   Neurological: Negative for dizziness, seizures, speech difficulty and weakness.  Psychiatric/Behavioral: Negative for hallucinations and confusion.    Allergies  Penicillins  Home Medications   Current Outpatient Rx  Name  Route  Sig  Dispense  Refill  . guaifenesin (ROBITUSSIN) 100 MG/5ML syrup   Oral   Take 200 mg by mouth 3 (three) times daily as needed for cough.         Marland Kitchen ibuprofen (ADVIL,MOTRIN) 200 MG tablet   Oral   Take 600 mg by mouth every 8 (eight) hours as needed for headache.         Marland Kitchen aspirin-acetaminophen-caffeine (EXCEDRIN MIGRAINE) 250-250-65 MG per tablet   Oral   Take 1 tablet by mouth every 6 (six) hours as needed for pain.         . Etonogestrel (IMPLANON [place])   Subcutaneous   Inject 1 each into the skin once.            BP 128/57  Temp(Src) 98.1 F (36.7 C) (Oral)  Resp 17  Ht 5\' 4"  (1.626 m)  Wt 180 lb (81.647 kg)  BMI 30.88 kg/m2  SpO2 100% Physical Exam  Nursing note and vitals reviewed. Constitutional: She is oriented to person, place, and time. She appears well-developed and well-nourished.  Non-toxic appearance.  HENT:  Head: Normocephalic.  Right Ear: Tympanic membrane and external ear normal.  Left Ear: Tympanic membrane and external ear normal.  Nasal congestion  Eyes: EOM and lids  are normal. Pupils are equal, round, and reactive to light.  Neck: Normal range of motion. Neck supple. Carotid bruit is not present.  Cardiovascular: Normal rate, regular rhythm, normal heart sounds, intact distal pulses and normal pulses.   Pulmonary/Chest: Breath sounds normal. No respiratory distress.  Abdominal: Soft. Bowel sounds are normal. There is no tenderness. There is no guarding.  Musculoskeletal:       Lumbar back: She exhibits decreased range of motion, tenderness, pain and spasm.       Back:  Lymphadenopathy:       Head (right side): No submandibular adenopathy present.       Head  (left side): No submandibular adenopathy present.    She has no cervical adenopathy.  Neurological: She is alert and oriented to person, place, and time. She has normal strength. No cranial nerve deficit or sensory deficit.  Skin: Skin is warm and dry.  Psychiatric: She has a normal mood and affect. Her speech is normal.    ED Course  Procedures (including critical care time) Labs Review Labs Reviewed - No data to display Imaging Review No results found.  EKG Interpretation   None       MDM  No diagnosis found. *I have reviewed nursing notes, vital signs, and all appropriate lab and imaging results for this patient.**  No gross neuro deficit noted. Vital signs stable. Pt advised to follow up with PCP for additional testing and management. Rx for decadron, valium and diclofenac given to the patient.  Kathie Dike, PA-C 10/17/13 1331

## 2013-10-15 NOTE — ED Notes (Signed)
Pt c/o lower back pain that radiates down right leg. Pain began this morning while at work. Pt is unsure of injury.

## 2013-10-26 NOTE — ED Provider Notes (Addendum)
Medical screening examination/treatment/procedure(s) were performed by non-physician practitioner and as supervising physician I was immediately available for consultation/collaboration.  EKG Interpretation   None       Medical screening examination/treatment/procedure(s) were performed by non-physician practitioner and as supervising physician I was immediately available for consultation/collaboration.  EKG Interpretation   None         Benny Lennert, MD 10/26/13 1558  Benny Lennert, MD 10/27/13 [phone]

## 2014-11-22 IMAGING — US US TRANSVAGINAL NON-OB
1 series · 14 of 25 positions shown · non-contrast
Comparison: 12/27/2009 CT

CLINICAL DATA: Pelvic pain.

TRANSABDOMINAL AND TRANSVAGINAL ULTRASOUND OF PELVIS
TECHNIQUE: Both transabdominal and transvaginal ultrasound
examinations of the pelvis were performed. Transabdominal technique
was performed for global imaging of the pelvis including uterus,
ovaries, adnexal regions, and pelvic cul-de-sac.
It was necessary to proceed with endovaginal exam following the
transabdominal exam to visualize the uterus, ovaries, and adnexa  .

[Series 1: us transvaginal non-ob · 0.18mm/px · 14 of 104 slices shown]
[im 1/104]
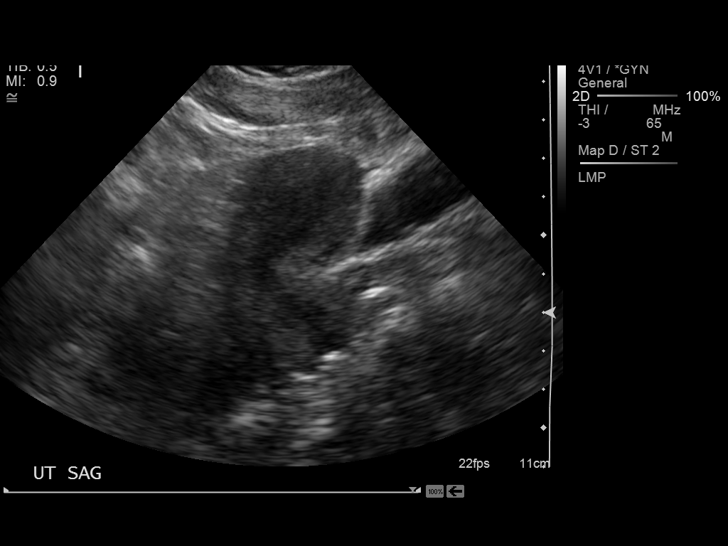
[im 9/104]
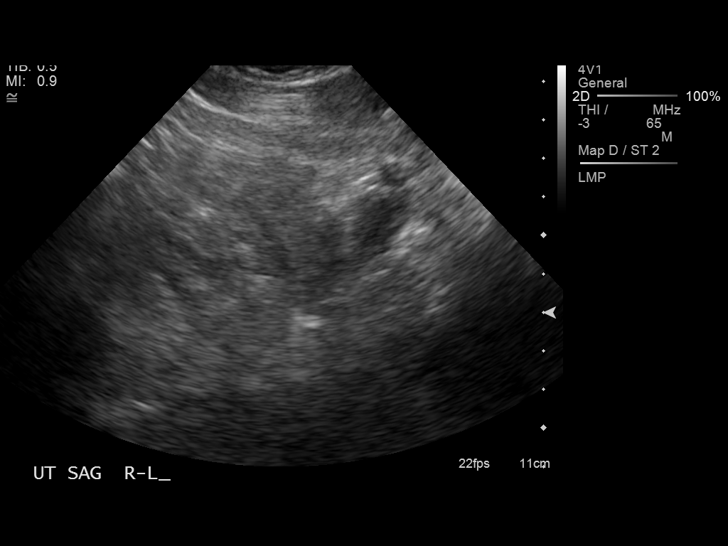
[im 18/104]
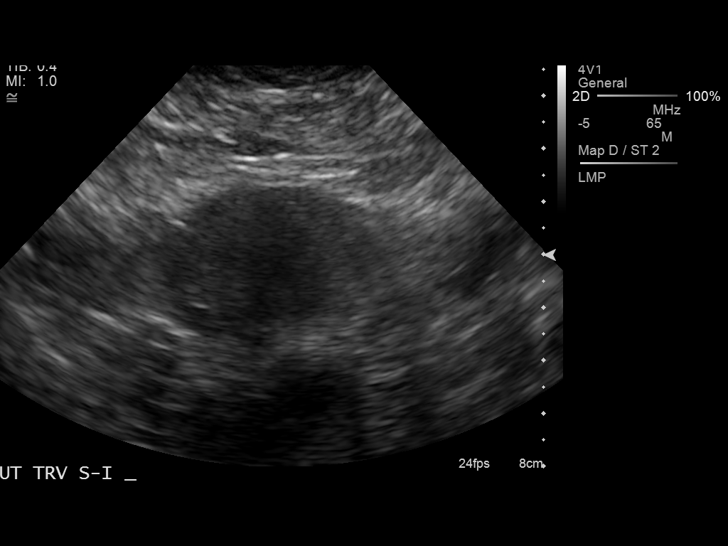
[im 26/104]
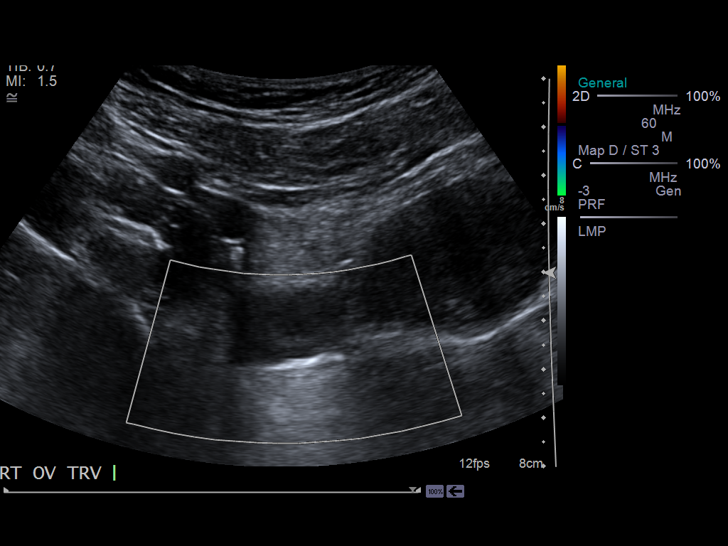
[im 35/104]
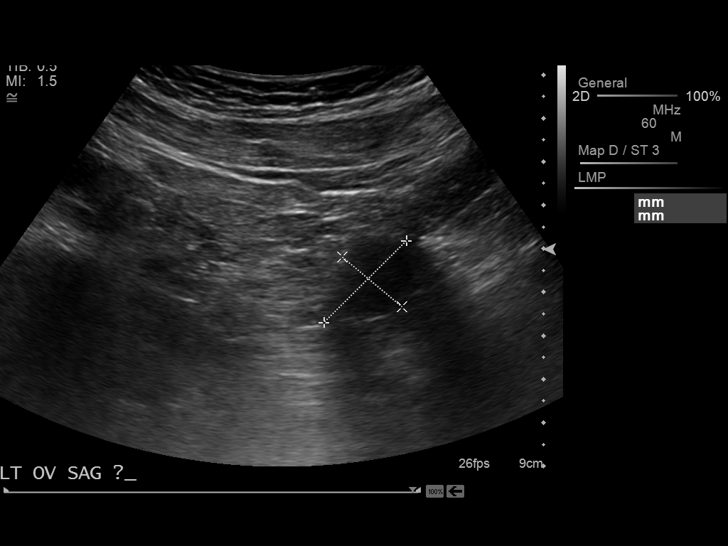
[im 39/104]
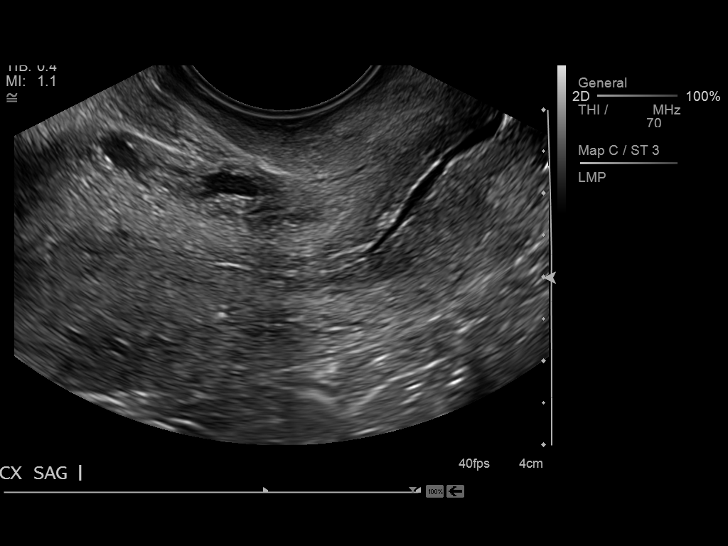
[im 48/104]
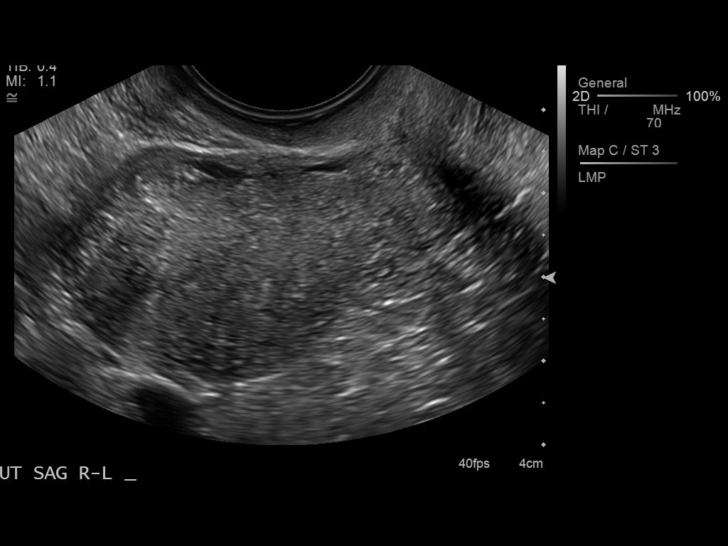
[im 56/104]
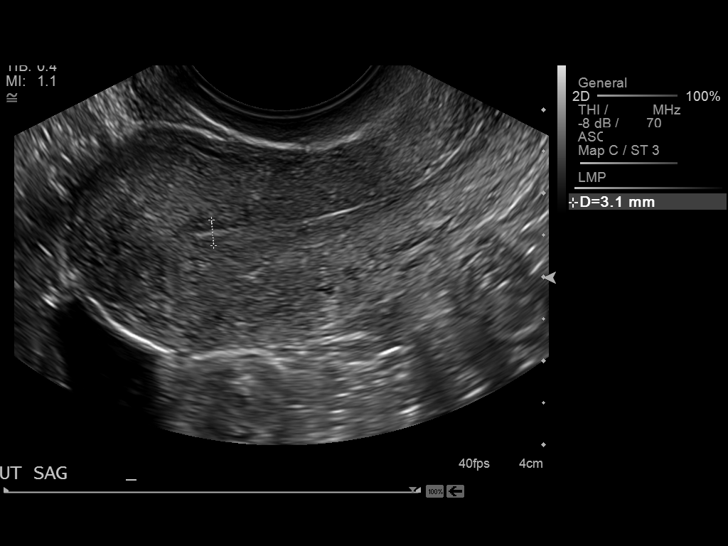
[im 65/104]
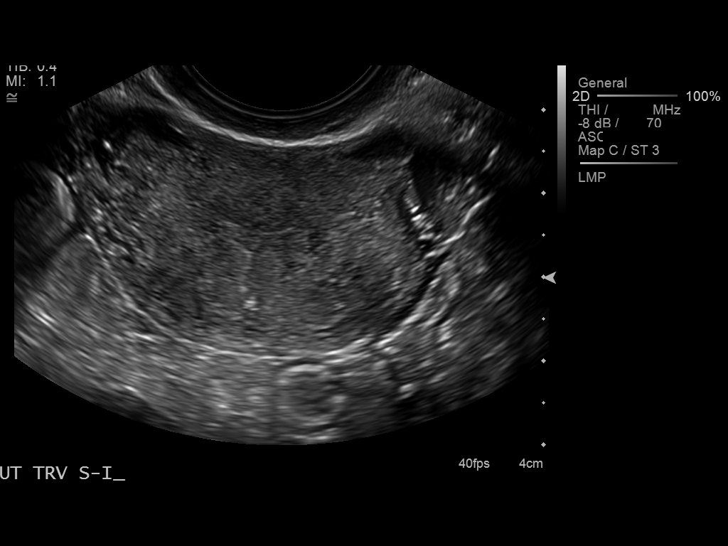
[im 69/104]
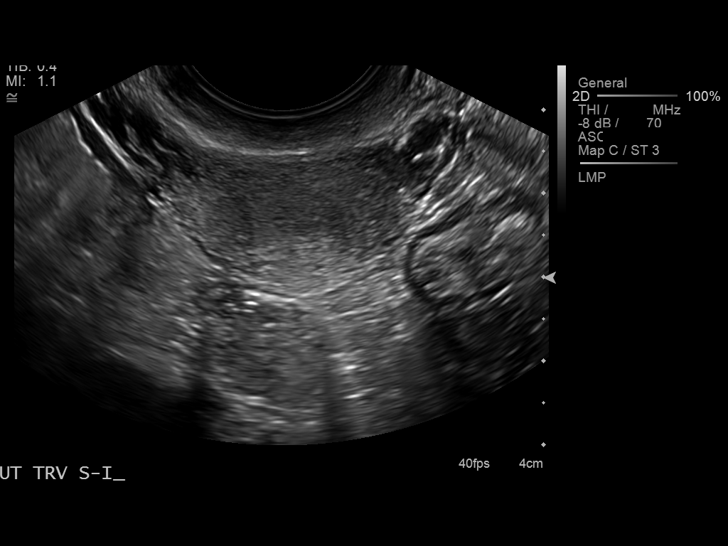
[im 78/104]
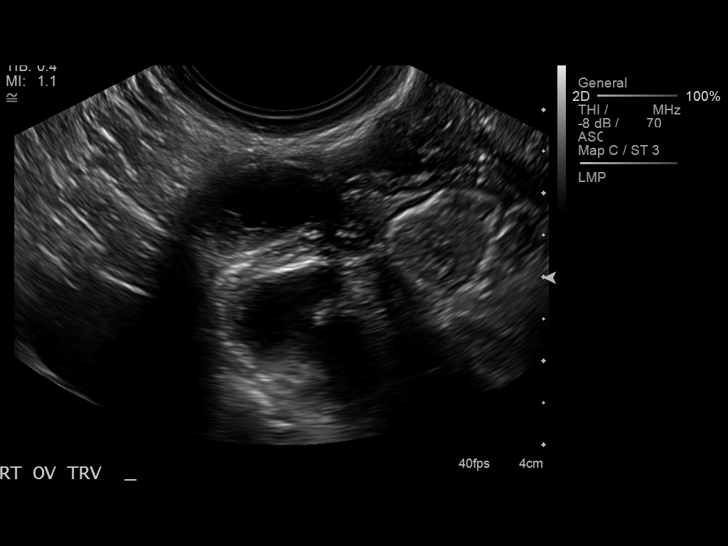
[im 86/104]
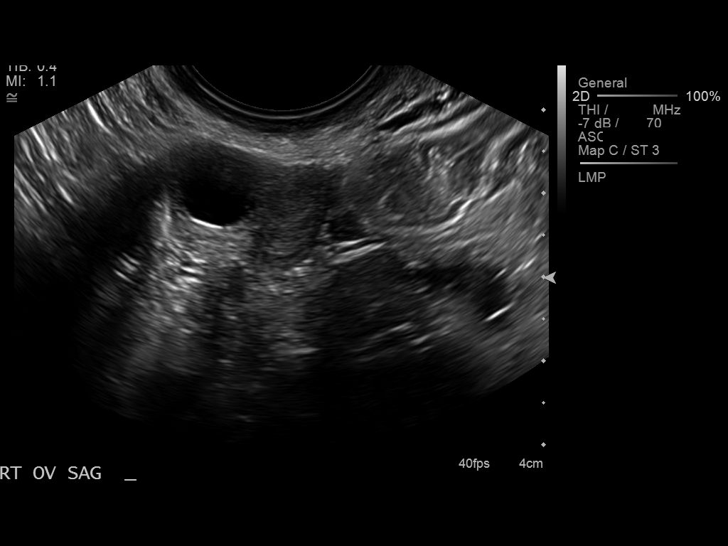
[im 95/104]
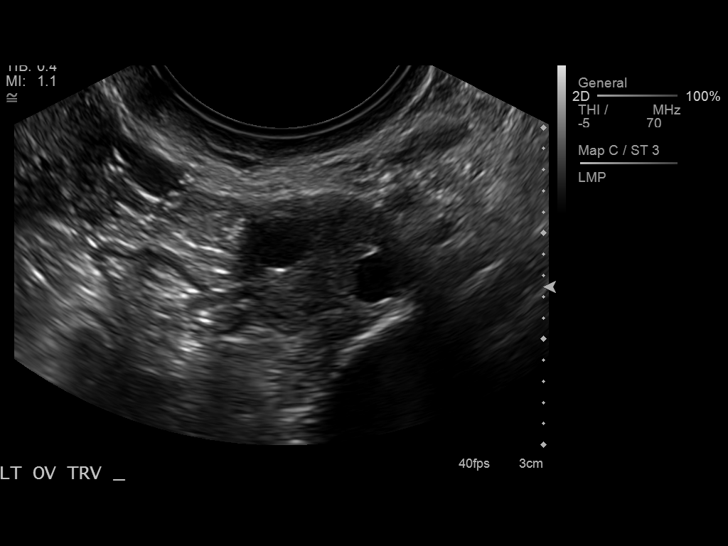
[im 104/104]
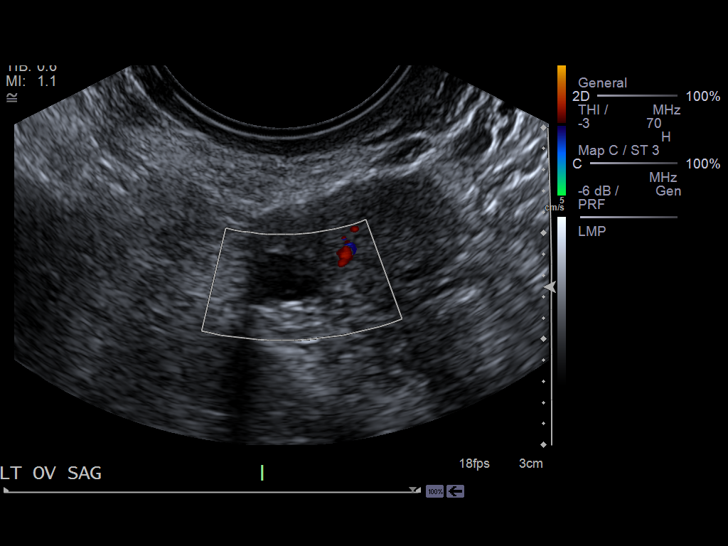

[14 of 25 positions shown; findings below may reference images not displayed]

FINDINGS: Uterus: 6.5 x 3.2 x 4.2 cm. Normal in morphology.

Endometrium: Normal, 3 mm.

Right ovary:  Normal appearance/no adnexal mass

A cylindrical structure in the right pelvis on image 73 is favored
to represent bowel, adjacent to the ovary.

Left ovary: Normal appearance/no adnexal mass

Other findings: No free fluid
IMPRESSION: Normal pelvic ultrasound for age.  No explanation for pain..

A cylindrical structure adjacent the right ovary is favored to
represent bowel.  If there is a clinical concern of hydrosalpinx
(which is felt less likely), consider CT or MRI.

## 2015-03-24 ENCOUNTER — Other Ambulatory Visit: Payer: Self-pay | Admitting: Adult Health

## 2015-03-29 ENCOUNTER — Other Ambulatory Visit: Payer: Self-pay | Admitting: Adult Health

## 2015-04-01 ENCOUNTER — Other Ambulatory Visit: Payer: Self-pay | Admitting: Adult Health

## 2015-04-19 ENCOUNTER — Encounter: Payer: Self-pay | Admitting: Adult Health

## 2015-04-19 ENCOUNTER — Other Ambulatory Visit (HOSPITAL_COMMUNITY)
Admission: RE | Admit: 2015-04-19 | Discharge: 2015-04-19 | Disposition: A | Discharge status: Home / Self Care | Payer: Medicaid Other | Source: Ambulatory Visit | Attending: Adult Health | Admitting: Adult Health

## 2015-04-19 ENCOUNTER — Ambulatory Visit (INDEPENDENT_AMBULATORY_CARE_PROVIDER_SITE_OTHER): Payer: Medicaid Other | Admitting: Adult Health

## 2015-04-19 VITALS — BP 104/72 | HR 72 | Ht 64.25 in | Wt 221.5 lb

## 2015-04-19 DIAGNOSIS — IMO0002 Reserved for concepts with insufficient information to code with codable children: Secondary | ICD-10-CM

## 2015-04-19 DIAGNOSIS — Z309 Encounter for contraceptive management, unspecified: Secondary | ICD-10-CM | POA: Diagnosis not present

## 2015-04-19 DIAGNOSIS — Z01419 Encounter for gynecological examination (general) (routine) without abnormal findings: Secondary | ICD-10-CM | POA: Insufficient documentation

## 2015-04-19 DIAGNOSIS — Z113 Encounter for screening for infections with a predominantly sexual mode of transmission: Secondary | ICD-10-CM | POA: Insufficient documentation

## 2015-04-19 DIAGNOSIS — Z3046 Encounter for surveillance of implantable subdermal contraceptive: Secondary | ICD-10-CM

## 2015-04-19 HISTORY — DX: Reserved for concepts with insufficient information to code with codable children: IMO0002

## 2015-04-19 NOTE — Progress Notes (Signed)
Patient ID: Angelica Henry, female   DOB: 09/13/1991, 24 y.o.   MRN: [id number] History of Present Illness: [name] is a 24 year old white female in for well woman gyn exam and pap, she has family planning medicaid. Last pap 09/23/12 and was normal.   Current Medications, Allergies, Past Medical History, Past Surgical History, Family History and Social History were reviewed in [facility] electronic medical record.     Review of Systems: Patient denies any headaches, hearing loss, fatigue, blurred vision, shortness of breath, chest pain, abdominal pain, problems with bowel movements,or urination. No joint pain or mood swings.She does have some pain/pressure with sex at times, seems positional.She has nexplanon and has a period about every 2 months, it is due to come out in December, this is her 3 rd one.She thinks she may want to get pregnant after that.    Physical Exam:BP 104/72 mmHg  Pulse 72  Ht 5' 4.25" (1.632 m)  Wt 221 lb 8 oz (100.472 kg)  BMI 37.72 kg/m2  LMP 03/14/2015 General:  Well developed, well nourished, no acute distress Skin:  Warm and dry, has numerous tattoos Neck:  Midline trachea, normal thyroid, good ROM, no lymphadenopathy Lungs; Clear to auscultation bilaterally Breast:  No dominant palpable mass, retraction, or nipple discharge Cardiovascular: Regular rate and rhythm Abdomen:  Soft, non tender, no hepatosplenomegaly Pelvic:  External genitalia is normal in appearance, no lesions.  The vagina is normal in appearance. Urethra has no lesions or masses. The cervix is smooth, pap with GC/CHL performed.Marland Kitchen.  Uterus is felt to be normal size, shape, and contour.  No adnexal masses or tenderness noted.Bladder is non tender, no masses felt. Extremities/musculoskeletal:  No swelling or varicosities noted, no clubbing or cyanosis Psych:  No mood changes, alert and cooperative,seems happy Discussed changing positions with sex, using pillows under hips.She is aware that it could  take 6-18 months of active trying to get pregnant, after nexplanon removed or could happen first time she tries.  Impression: Well woman gyn exam with pap--family planning medicaid Dyspareunia Nexplanon monitoring     Plan: Check HIV,RPR and HSV2 Return in 6 months for nexplanon removal Physical in 1 year Take folic acid 1 mg or prenatal vitamin about 3 months before nexplanon removal

## 2015-04-19 NOTE — Patient Instructions (Signed)
Change positions with sex Return in 6 months for removal of nexplanon Take folic acid 1 mg or prenatal vitamin Physical in 1 year

## 2015-04-20 LAB — RPR: RPR Ser Ql: NONREACTIVE

## 2015-04-20 LAB — HIV ANTIBODY (ROUTINE TESTING W REFLEX): HIV Screen 4th Generation wRfx: NONREACTIVE

## 2015-04-20 LAB — HSV 2 ANTIBODY, IGG: HSV 2 Glycoprotein G Ab, IgG: 14.2 index — ABNORMAL HIGH (ref 0.00–0.90)

## 2015-04-22 ENCOUNTER — Telehealth: Payer: Self-pay | Admitting: Adult Health

## 2015-04-22 ENCOUNTER — Encounter: Payer: Self-pay | Admitting: Adult Health

## 2015-04-22 LAB — CYTOLOGY - PAP

## 2015-04-22 NOTE — Telephone Encounter (Signed)
Pt aware of pap and labs and she said she knew about herpes

## 2015-04-22 NOTE — Telephone Encounter (Signed)
Left message to call about labs 

## 2015-11-09 ENCOUNTER — Encounter: Payer: Medicaid Other | Admitting: Women's Health

## 2015-11-10 ENCOUNTER — Encounter: Payer: Medicaid Other | Admitting: Women's Health

## 2015-11-17 ENCOUNTER — Encounter: Payer: Self-pay | Admitting: Women's Health

## 2015-11-17 ENCOUNTER — Ambulatory Visit (INDEPENDENT_AMBULATORY_CARE_PROVIDER_SITE_OTHER): Payer: Medicaid Other | Admitting: Women's Health

## 2015-11-17 VITALS — BP 118/62 | HR 68 | Wt 224.0 lb

## 2015-11-17 DIAGNOSIS — Z309 Encounter for contraceptive management, unspecified: Secondary | ICD-10-CM | POA: Diagnosis not present

## 2015-11-17 DIAGNOSIS — Z3046 Encounter for surveillance of implantable subdermal contraceptive: Secondary | ICD-10-CM

## 2015-11-17 NOTE — Progress Notes (Signed)
Patient ID: Angelica Henry, female   DOB: 12/10/1990, 25 y.o.   MRN: [id number] Angelica Henry is a 25 y.o. year old Caucasian female here for Nexplanon removal, she would like to get pregnant. Not taking pnv yet, no etoh/smoking/illicit drug use.  Patient given informed consent for removal of her Nexplanon.  BP 118/62 mmHg  Pulse 68  Wt 224 lb (101.606 kg)  Appropriate time out taken. Nexplanon site identified.  Area prepped in usual sterile fashon. One cc of 2% lidocaine was used to anesthetize the area at the distal end of the implant. A small stab incision was made right beside the implant on the distal portion.  The Nexplanon rod was grasped using hemostats and removed without difficulty.  There was less than 3 cc blood loss. There were no complications.  Steri-strips were applied over the small incision and a pressure bandage was applied.  The patient tolerated the procedure well.  She was instructed to keep the area clean and dry, remove pressure bandage in 24 hours, and keep insertion site covered with the steri-strip for 3-5 days.    Follow-up PRN problems. Begin pnv today, call when pregnant  Marge DuncansBooker, Angelica Henry CNM, [facility]NP-BC 11/17/2015 3:37 PM

## 2015-11-17 NOTE — Patient Instructions (Signed)
Keep the area clean and dry.  You can remove the big bandage in 24 hours, and the small steri-strip bandage in 3-5 days.  A back up method, such as condoms, should be used for two weeks.   Start taking a prenatal vitamin today  Call us when you're pregnant

## 2016-06-27 ENCOUNTER — Encounter: Payer: Self-pay | Admitting: Adult Health

## 2016-06-27 ENCOUNTER — Ambulatory Visit (INDEPENDENT_AMBULATORY_CARE_PROVIDER_SITE_OTHER): Payer: Managed Care, Other (non HMO) | Admitting: Adult Health

## 2016-06-27 VITALS — BP 114/60 | HR 90 | Ht 65.0 in | Wt 216.0 lb

## 2016-06-27 DIAGNOSIS — Z3490 Encounter for supervision of normal pregnancy, unspecified, unspecified trimester: Secondary | ICD-10-CM

## 2016-06-27 DIAGNOSIS — Z3201 Encounter for pregnancy test, result positive: Secondary | ICD-10-CM | POA: Diagnosis not present

## 2016-06-27 DIAGNOSIS — O3680X0 Pregnancy with inconclusive fetal viability, not applicable or unspecified: Secondary | ICD-10-CM

## 2016-06-27 LAB — POCT URINE PREGNANCY: Preg Test, Ur: POSITIVE — AB

## 2016-06-27 MED ORDER — PRENATAL PLUS 27-1 MG PO TABS: 1.0000 | ORAL_TABLET | Freq: Every day | ORAL | 12 refills | Status: DC

## 2016-06-27 NOTE — Progress Notes (Signed)
Subjective:     Patient ID: Angelica Henry, female   DOB: 06/16/1991, 25 y.o.   MRN: [id number]  HPI Angelica Henry is a 25 year old white female in for UPT, she has had 2+ HPT.She had nexplanon removed in January.She says she only smokes 1 cigarette a day.  Review of Systems Patient denies any daily headaches, hearing loss, fatigue, blurred vision, shortness of breath, chest pain, abdominal pain, problems with bowel movements, urination, or intercourse. No joint pain or mood swings.No nausea,vomiting or bleeding. Reviewed past medical,surgical, social and family history. Reviewed medications and allergies.     Objective:   Physical Exam BP 114/60 (BP Location: Left Arm, Patient Position: Sitting, Cuff Size: Large)   Pulse 90   Ht 5\' 5"  (1.651 m)   Wt 216 lb (98 kg)   LMP 05/14/2016   BMI 35.94 kg/m    UPT +, about 6+2 weeks by LMP with EDD 02/18/17, Skin warm and dry. Neck: mid line trachea, normal thyroid, good ROM, no lymphadenopathy noted. Lungs: clear to ausculation bilaterally. Cardiovascular: regular rate and rhythm.Abdomen is soft and non tender.try to stop smoking altogether.   Assessment:     UPT + Pregnant     Plan:     Rx prenatal plus #30 take 1 daily with 11 refills Return in 1 week for dating US Review handout on first trimester

## 2016-06-27 NOTE — Patient Instructions (Signed)
First Trimester of Pregnancy The first trimester of pregnancy is from week 1 until the end of week 12 (months 1 through 3). A week after a sperm fertilizes an egg, the egg will implant on the wall of the uterus. This embryo will begin to develop into a baby. Genes from you and your partner are forming the baby. The female genes determine whether the baby is a boy or a girl. At 6-8 weeks, the eyes and face are formed, and the heartbeat can be seen on ultrasound. At the end of 12 weeks, all the baby's organs are formed.  Now that you are pregnant, you will want to do everything you can to have a healthy baby. Two of the most important things are to get good prenatal care and to follow your health care provider's instructions. Prenatal care is all the medical care you receive before the baby's birth. This care will help prevent, find, and treat any problems during the pregnancy and childbirth. BODY CHANGES Your body goes through many changes during pregnancy. The changes vary from woman to woman.   You may gain or lose a couple of pounds at first.  You may feel sick to your stomach (nauseous) and throw up (vomit). If the vomiting is uncontrollable, call your health care provider.  You may tire easily.  You may develop headaches that can be relieved by medicines approved by your health care provider.  You may urinate more often. Painful urination may mean you have a bladder infection.  You may develop heartburn as a result of your pregnancy.  You may develop constipation because certain hormones are causing the muscles that push waste through your intestines to slow down.  You may develop hemorrhoids or swollen, bulging veins (varicose veins).  Your breasts may begin to grow larger and become tender. Your nipples may stick out more, and the tissue that surrounds them (areola) may become darker.  Your gums may bleed and may be sensitive to brushing and flossing.  Dark spots or blotches (chloasma,  mask of pregnancy) may develop on your face. This will likely fade after the baby is born.  Your menstrual periods will stop.  You may have a loss of appetite.  You may develop cravings for certain kinds of food.  You may have changes in your emotions from day to day, such as being excited to be pregnant or being concerned that something may go wrong with the pregnancy and baby.  You may have more vivid and strange dreams.  You may have changes in your hair. These can include thickening of your hair, rapid growth, and changes in texture. Some women also have hair loss during or after pregnancy, or hair that feels dry or thin. Your hair will most likely return to normal after your baby is born. WHAT TO EXPECT AT YOUR PRENATAL VISITS During a routine prenatal visit:  You will be weighed to make sure you and the baby are growing normally.  Your blood pressure will be taken.  Your abdomen will be measured to track your baby's growth.  The fetal heartbeat will be listened to starting around week 10 or 12 of your pregnancy.  Test results from any previous visits will be discussed. Your health care provider may ask you:  How you are feeling.  If you are feeling the baby move.  If you have had any abnormal symptoms, such as leaking fluid, bleeding, severe headaches, or abdominal cramping.  If you are using any tobacco products,   including cigarettes, chewing tobacco, and electronic cigarettes.  If you have any questions. Other tests that may be performed during your first trimester include:  Blood tests to find your blood type and to check for the presence of any previous infections. They will also be used to check for low iron levels (anemia) and Rh antibodies. Later in the pregnancy, blood tests for diabetes will be done along with other tests if problems develop.  Urine tests to check for infections, diabetes, or protein in the urine.  An ultrasound to confirm the proper growth  and development of the baby.  An amniocentesis to check for possible genetic problems.  Fetal screens for spina bifida and Down syndrome.  You may need other tests to make sure you and the baby are doing well.  HIV (human immunodeficiency virus) testing. Routine prenatal testing includes screening for HIV, unless you choose not to have this test. HOME CARE INSTRUCTIONS  Medicines  Follow your health care provider's instructions regarding medicine use. Specific medicines may be either safe or unsafe to take during pregnancy.  Take your prenatal vitamins as directed.  If you develop constipation, try taking a stool softener if your health care provider approves. Diet  Eat regular, well-balanced meals. Choose a variety of foods, such as meat or vegetable-based protein, fish, milk and low-fat dairy products, vegetables, fruits, and whole grain breads and cereals. Your health care provider will help you determine the amount of weight gain that is right for you.  Avoid raw meat and uncooked cheese. These carry germs that can cause birth defects in the baby.  Eating four or five small meals rather than three large meals a day may help relieve nausea and vomiting. If you start to feel nauseous, eating a few soda crackers can be helpful. Drinking liquids between meals instead of during meals also seems to help nausea and vomiting.  If you develop constipation, eat more high-fiber foods, such as fresh vegetables or fruit and whole grains. Drink enough fluids to keep your urine clear or pale yellow. Activity and Exercise  Exercise only as directed by your health care provider. Exercising will help you:  Control your weight.  Stay in shape.  Be prepared for labor and delivery.  Experiencing pain or cramping in the lower abdomen or low back is a good sign that you should stop exercising. Check with your health care provider before continuing normal exercises.  Try to avoid standing for long  periods of time. Move your legs often if you must stand in one place for a long time.  Avoid heavy lifting.  Wear low-heeled shoes, and practice good posture.  You may continue to have sex unless your health care provider directs you otherwise. Relief of Pain or Discomfort  Wear a good support bra for breast tenderness.   Take warm sitz baths to soothe any pain or discomfort caused by hemorrhoids. Use hemorrhoid cream if your health care provider approves.   Rest with your legs elevated if you have leg cramps or low back pain.  If you develop varicose veins in your legs, wear support hose. Elevate your feet for 15 minutes, 3-4 times a day. Limit salt in your diet. Prenatal Care  Schedule your prenatal visits by the twelfth week of pregnancy. They are usually scheduled monthly at first, then more often in the last 2 months before delivery.  Write down your questions. Take them to your prenatal visits.  Keep all your prenatal visits as directed by your   health care provider. Safety  Wear your seat belt at all times when driving.  Make a list of emergency phone numbers, including numbers for family, friends, the hospital, and police and fire departments. General Tips  Ask your health care provider for a referral to a local prenatal education class. Begin classes no later than at the beginning of month 6 of your pregnancy.  Ask for help if you have counseling or nutritional needs during pregnancy. Your health care provider can offer advice or refer you to specialists for help with various needs.  Do not use hot tubs, steam rooms, or saunas.  Do not douche or use tampons or scented sanitary pads.  Do not cross your legs for long periods of time.  Avoid cat litter boxes and soil used by cats. These carry germs that can cause birth defects in the baby and possibly loss of the fetus by miscarriage or stillbirth.  Avoid all smoking, herbs, alcohol, and medicines not prescribed by  your health care provider. Chemicals in these affect the formation and growth of the baby.  Do not use any tobacco products, including cigarettes, chewing tobacco, and electronic cigarettes. If you need help quitting, ask your health care provider. You may receive counseling support and other resources to help you quit.  Schedule a dentist appointment. At home, brush your teeth with a soft toothbrush and be gentle when you floss. SEEK MEDICAL CARE IF:   You have dizziness.  You have mild pelvic cramps, pelvic pressure, or nagging pain in the abdominal area.  You have persistent nausea, vomiting, or diarrhea.  You have a bad smelling vaginal discharge.  You have pain with urination.  You notice increased swelling in your face, hands, legs, or ankles. SEEK IMMEDIATE MEDICAL CARE IF:   You have a fever.  You are leaking fluid from your vagina.  You have spotting or bleeding from your vagina.  You have severe abdominal cramping or pain.  You have rapid weight gain or loss.  You vomit blood or material that looks like coffee grounds.  You are exposed to German measles and have never had them.  You are exposed to fifth disease or chickenpox.  You develop a severe headache.  You have shortness of breath.  You have any kind of trauma, such as from a fall or a car accident.   This information is not intended to replace advice given to you by your health care provider. Make sure you discuss any questions you have with your health care provider.   Document Released: 10/24/2001 Document Revised: 11/20/2014 Document Reviewed: 09/09/2013 Elsevier Interactive Patient Education 2016 [employer]  

## 2016-07-05 ENCOUNTER — Other Ambulatory Visit: Payer: Medicaid Other

## 2016-07-24 ENCOUNTER — Ambulatory Visit (INDEPENDENT_AMBULATORY_CARE_PROVIDER_SITE_OTHER): Payer: Managed Care, Other (non HMO)

## 2016-07-24 ENCOUNTER — Encounter: Payer: Self-pay | Admitting: Obstetrics & Gynecology

## 2016-07-24 ENCOUNTER — Ambulatory Visit (INDEPENDENT_AMBULATORY_CARE_PROVIDER_SITE_OTHER): Payer: Managed Care, Other (non HMO) | Admitting: Obstetrics & Gynecology

## 2016-07-24 ENCOUNTER — Other Ambulatory Visit: Payer: Self-pay | Admitting: Adult Health

## 2016-07-24 VITALS — BP 108/70 | HR 72 | Wt 215.0 lb

## 2016-07-24 DIAGNOSIS — O200 Threatened abortion: Secondary | ICD-10-CM

## 2016-07-24 DIAGNOSIS — O3680X0 Pregnancy with inconclusive fetal viability, not applicable or unspecified: Secondary | ICD-10-CM | POA: Diagnosis not present

## 2016-07-24 DIAGNOSIS — Z3A10 10 weeks gestation of pregnancy: Secondary | ICD-10-CM

## 2016-07-24 NOTE — Progress Notes (Signed)
US TA/TV: GS  34.3 mm=8+4 wks w/a 6wks fetal pole,no fht seen,enlarged YS 13.7 mm,second GS 12.0 mm=6 wk,no ys or fetal pole seen,normal ov's bilat

## 2016-07-24 NOTE — Progress Notes (Signed)
Follow up appointment for results  Chief Complaint  Patient presents with  . work-in-ob    ultrasound    Blood pressure 108/70, pulse 72, weight 215 lb (97.5 kg), last menstrual period 05/14/2016.  [place] Ob Comp Less 14 Wks  Result Date: 07/24/2016 DATING AND VIABILITY SONOGRAM Angelica Henry is a 25 y.o. year old G1P0000 with LMP 05/16/2016  which would correlate to  9+[redacted] weeks gestation.  She has regular menstrual cycles.   She is here today for a confirmatory initial sonogram. GESTATION TWINS  FETAL ACTIVITY:          Heart rate         No fht          The fetus is inactive. CERVIX: Appears closed ADNEXA: The ovaries are normal. GESTATIONAL AGE AND  BIOMETRICS: Gestational criteria: Estimated Date of Delivery: 02/20/2017. by LMP now at 9+6 wks Previous Scans:0 GESTATIONAL SAC #1 GESTATIONAL SAC #2           34.3 mm            12.0 mm         8+4 weeks          6wks CROWN RUMP LENGTH           4.2 mm         6 weeks                                                                      AVERAGE EGA(BY THIS SCAN):  6 weeks  TECHNICIAN COMMENTS: US TA/TV: GS  34.3 mm=8+4 wks w/a 6wks fetal pole,no fht seen,enlarged YS 13.7 mm,second GS 12.0 mm=6 wk,no ys or fetal pole seen,normal ov's bilat A copy of this report including all images has been saved and backed up to a second source for retrieval if needed. All measures and details of the anatomical scan, placentation, fluid volume and pelvic anatomy are contained in that report. Angelica Henry 07/24/2016 10:23 AM   [place] Ob Transvaginal  Result Date: 07/24/2016 DATING AND VIABILITY SONOGRAM Angelica CzarShea A Angelica Henry is a 25 y.o. year old G1P0000 with LMP 05/16/2016  which would correlate to  9+[redacted] weeks gestation.  She has regular menstrual cycles.   She is here today for a confirmatory initial sonogram. GESTATION TWINS  FETAL ACTIVITY:          Heart rate         No fht          The fetus is inactive. CERVIX: Appears closed ADNEXA: The ovaries are normal. GESTATIONAL AGE AND   BIOMETRICS: Gestational criteria: Estimated Date of Delivery: 02/20/2017. by LMP now at 9+6 wks Previous Scans:0 GESTATIONAL SAC #1 GESTATIONAL SAC #2           34.3 mm            12.0 mm         8+4 weeks          6wks CROWN RUMP LENGTH           4.2 mm         6 weeks  AVERAGE EGA(BY THIS SCAN):  6 weeks  TECHNICIAN COMMENTS: [place] TA/TV: GS  34.3 mm=8+4 wks w/a 6wks fetal pole,no fht seen,enlarged YS 13.7 mm,second GS 12.0 mm=6 wk,no ys or fetal pole seen,normal ov's bilat A copy of this report including all images has been saved and backed up to a second source for retrieval if needed. All measures and details of the anatomical scan, placentation, fluid volume and pelvic anatomy are contained in that report. Angelica Henry 07/24/2016 10:23 AM      MEDS ordered this encounter: Meds ordered this encounter  Medications  . acetaminophen (TYLENOL) 325 MG tablet    Sig: Take 650 mg by mouth every 6 (six) hours as needed.    Orders for this encounter: Orders Placed This Encounter  Procedures  . Beta HCG, Quant   Discussion: The pregnancy is not consistent with the given dates Additionally the discrepancy between the GS and CRL measures and the  absence of fetal cardiac activity with a CRL of 4.2 mm is not reassuring for a viable pregnancy. I informed the patient and her present family of my concern for the viability of the pregnancy As a result will perform sequential quantitative HCG levels to evaluate the pregnancy status  Drawn today and repeat in 3 days and base further evaluation and clinical decision making based on those levels  Pt understands Plan: As above Follow Up: Quantitative HCG 3 days and I will call with interpretation of results at that time    Face to face time:  15 minutes  Greater than 50% of the visit time was spent in counseling and coordination of care with the patient.  The summary and outline of the  counseling and care coordination is summarized in the note above.   All questions were answered.  Past Medical History:  Diagnosis Date  . Asthma   . Dyspareunia 04/19/2015  . Herpes simplex virus (HSV) infection     History reviewed. No pertinent surgical history.  OB History    Gravida Para Term Preterm AB Living   1 0 0 0 0 0   SAB TAB Ectopic Multiple Live Births   0 0 0 0        Allergies  Allergen Reactions  . Penicillins Other (See Comments)    Flushing    Social History   Social History  . Marital status: Single    Spouse name: N/A  . Number of children: N/A  . Years of education: N/A   Social History Main Topics  . Smoking status: Current Every Day Smoker    Packs/day: 0.00    Years: 11.00    Types: Cigarettes  . Smokeless tobacco: Never Used     Comment: smokes 1 cig daily  . Alcohol use No  . Drug use: No  . Sexual activity: Yes    Birth control/ protection: None   Other Topics Concern  . None   Social History Narrative  . None    Family History  Problem Relation Age of Onset  . Cancer Mother     cervical  . Heart disease Father   . Other Sister     cyst on ovary  . Glaucoma Maternal Grandmother   . Heart disease Maternal Grandmother   . Cirrhosis Maternal Grandfather   . Cancer Maternal Grandfather   . Other Paternal Grandfather     brain aneursym

## 2016-07-25 LAB — BETA HCG QUANT (REF LAB): hCG Quant: 33541 m[IU]/mL

## 2016-07-27 ENCOUNTER — Other Ambulatory Visit: Payer: Managed Care, Other (non HMO)

## 2016-07-27 DIAGNOSIS — O200 Threatened abortion: Secondary | ICD-10-CM

## 2016-07-28 ENCOUNTER — Telehealth: Payer: Self-pay | Admitting: Obstetrics & Gynecology

## 2016-07-28 LAB — BETA HCG QUANT (REF LAB): HCG QUANT: 26307 m[IU]/mL

## 2016-07-28 NOTE — Telephone Encounter (Signed)
Called to review HCG levels which unfortunatley are going down which represents a non viable pregnancy  Lazaro ArmsEURE,James Senn H, MD

## 2016-07-31 ENCOUNTER — Ambulatory Visit (INDEPENDENT_AMBULATORY_CARE_PROVIDER_SITE_OTHER): Payer: Managed Care, Other (non HMO) | Admitting: Obstetrics & Gynecology

## 2016-07-31 ENCOUNTER — Encounter (HOSPITAL_COMMUNITY): Payer: Self-pay

## 2016-07-31 ENCOUNTER — Encounter: Payer: Self-pay | Admitting: Obstetrics & Gynecology

## 2016-07-31 ENCOUNTER — Encounter (HOSPITAL_COMMUNITY)
Admission: RE | Admit: 2016-07-31 | Discharge: 2016-07-31 | Disposition: A | Discharge status: Home / Self Care | Payer: Managed Care, Other (non HMO) | Source: Ambulatory Visit | Attending: Obstetrics & Gynecology | Admitting: Obstetrics & Gynecology

## 2016-07-31 VITALS — BP 110/60 | HR 74 | Wt 216.0 lb

## 2016-07-31 DIAGNOSIS — O021 Missed abortion: Secondary | ICD-10-CM | POA: Diagnosis present

## 2016-07-31 DIAGNOSIS — Z3A00 Weeks of gestation of pregnancy not specified: Secondary | ICD-10-CM | POA: Diagnosis not present

## 2016-07-31 LAB — CBC
HCT: 35.8 % — ABNORMAL LOW (ref 36.0–46.0)
Hemoglobin: 11.6 g/dL — ABNORMAL LOW (ref 12.0–15.0)
MCH: 29.3 pg (ref 26.0–34.0)
MCHC: 32.4 g/dL (ref 30.0–36.0)
MCV: 90.4 fL (ref 78.0–100.0)
PLATELETS: 279 10*3/uL (ref 150–400)
RBC: 3.96 MIL/uL (ref 3.87–5.11)
RDW: 13.5 % (ref 11.5–15.5)
WBC: 11.6 10*3/uL — ABNORMAL HIGH (ref 4.0–10.5)

## 2016-07-31 LAB — COMPREHENSIVE METABOLIC PANEL
ALT: 12 U/L — ABNORMAL LOW (ref 14–54)
ANION GAP: 3 — AB (ref 5–15)
AST: 12 U/L — ABNORMAL LOW (ref 15–41)
Albumin: 4 g/dL (ref 3.5–5.0)
Alkaline Phosphatase: 73 U/L (ref 38–126)
BUN: 7 mg/dL (ref 6–20)
CALCIUM: 8.9 mg/dL (ref 8.9–10.3)
CHLORIDE: 110 mmol/L (ref 101–111)
CO2: 25 mmol/L (ref 22–32)
Creatinine, Ser: 0.55 mg/dL (ref 0.44–1.00)
GFR calc non Af Amer: >60 mL/min (ref >60)
Glucose, Bld: 92 mg/dL (ref 65–99)
Potassium: 3.7 mmol/L (ref 3.5–5.1)
SODIUM: 138 mmol/L (ref 135–145)
Total Bilirubin: 0.3 mg/dL (ref 0.3–1.2)
Total Protein: 7.4 g/dL (ref 6.5–8.1)

## 2016-07-31 LAB — URINALYSIS, ROUTINE W REFLEX MICROSCOPIC
BILIRUBIN URINE: NEGATIVE
Glucose, UA: NEGATIVE mg/dL
Hgb urine dipstick: NEGATIVE
Ketones, ur: NEGATIVE mg/dL
Leukocytes, UA: NEGATIVE
NITRITE: NEGATIVE
Protein, ur: NEGATIVE mg/dL
SPECIFIC GRAVITY, URINE: 1.02 (ref 1.005–1.030)
pH: 6 (ref 5.0–8.0)

## 2016-07-31 LAB — HCG, QUANTITATIVE, PREGNANCY: hCG, Beta Chain, Quant, S: 22340 m[IU]/mL — ABNORMAL HIGH (ref <5)

## 2016-07-31 NOTE — Progress Notes (Signed)
Preoperative History and Physical  Angelica Henry is a 25 y.o. G1P0000 with Patient's last menstrual period was 05/14/2016. admitted for a suction sharp uterine curettage due to a missed ab in the first trimester.  Had a sonogram which was discrepant and HCG levels ordered which are going down As a result this pregnancy is a missed ab  PMH:    Past Medical History:  Diagnosis Date  . Asthma   . Dyspareunia 04/19/2015  . Herpes simplex virus (HSV) infection     PSH:    History reviewed. No pertinent surgical history.  POb/GynH:      OB History    Gravida Para Term Preterm AB Living   1 0 0 0 0 0   SAB TAB Ectopic Multiple Live Births   0 0 0 0        SH:   Social History  Substance Use Topics  . Smoking status: Current Every Day Smoker    Packs/day: 0.00    Years: 11.00    Types: Cigarettes  . Smokeless tobacco: Never Used     Comment: smokes 1 cig daily  . Alcohol use No    FH:    Family History  Problem Relation Age of Onset  . Cancer Mother     cervical  . Heart disease Father   . Other Sister     cyst on ovary  . Glaucoma Maternal Grandmother   . Heart disease Maternal Grandmother   . Cirrhosis Maternal Grandfather   . Cancer Maternal Grandfather   . Other Paternal Grandfather     brain aneursym     Allergies:  Allergies  Allergen Reactions  . Penicillins Other (See Comments)    Flushing    Medications:      No current outpatient prescriptions on file.  Review of Systems:   Review of Systems  Constitutional: Negative for fever, chills, weight loss, malaise/fatigue and diaphoresis.  HENT: Negative for hearing loss, ear pain, nosebleeds, congestion, sore throat, neck pain, tinnitus and ear discharge.   Eyes: Negative for blurred vision, double vision, photophobia, pain, discharge and redness.  Respiratory: Negative for cough, hemoptysis, sputum production, shortness of breath, wheezing and stridor.   Cardiovascular: Negative for chest pain,  palpitations, orthopnea, claudication, leg swelling and PND.  Gastrointestinal: Positive for abdominal pain. Negative for heartburn, nausea, vomiting, diarrhea, constipation, blood in stool and melena.  Genitourinary: Negative for dysuria, urgency, frequency, hematuria and flank pain.  Musculoskeletal: Negative for myalgias, back pain, joint pain and falls.  Skin: Negative for itching and rash.  Neurological: Negative for dizziness, tingling, tremors, sensory change, speech change, focal weakness, seizures, loss of consciousness, weakness and headaches.  Endo/Heme/Allergies: Negative for environmental allergies and polydipsia. Does not bruise/bleed easily.  Psychiatric/Behavioral: Negative for depression, suicidal ideas, hallucinations, memory loss and substance abuse. The patient is not nervous/anxious and does not have insomnia.      PHYSICAL EXAM:  Blood pressure 110/60, pulse 74, weight 216 lb (98 kg), last menstrual period 05/14/2016.    Vitals reviewed. Constitutional: She is oriented to person, place, and time. She appears well-developed and well-nourished.  HENT:  Head: Normocephalic and atraumatic.  Right Ear: External ear normal.  Left Ear: External ear normal.  Nose: Nose normal.  Mouth/Throat: Oropharynx is clear and moist.  Eyes: Conjunctivae and EOM are normal. Pupils are equal, round, and reactive to light. Right eye exhibits no discharge. Left eye exhibits no discharge. No scleral icterus.  Neck: Normal range of motion. Neck  supple. No tracheal deviation present. No thyromegaly present.  Cardiovascular: Normal rate, regular rhythm, normal heart sounds and intact distal pulses.  Exam reveals no gallop and no friction rub.   No murmur heard. Respiratory: Effort normal and breath sounds normal. No respiratory distress. She has no wheezes. She has no rales. She exhibits no tenderness.  GI: Soft. Bowel sounds are normal. She exhibits no distension and no mass. There is  tenderness. There is no rebound and no guarding.  Genitourinary:       Vulva is normal without lesions Vagina is pink moist without discharge Cervix normal in appearance and pap is normal Uterus is normal size, contour, position, consistency, mobility, non-tender Adnexa is negative with normal sized ovaries by sonogram  Musculoskeletal: Normal range of motion. She exhibits no edema and no tenderness.  Neurological: She is alert and oriented to person, place, and time. She has normal reflexes. She displays normal reflexes. No cranial nerve deficit. She exhibits normal muscle tone. Coordination normal.  Skin: Skin is warm and dry. No rash noted. No erythema. No pallor.  Psychiatric: She has a normal mood and affect. Her behavior is normal. Judgment and thought content normal.    Labs: Results for orders placed or performed in visit on 07/27/16 (from the past 336 hour(s))  Beta HCG, Quant   Collection Time: 07/27/16  3:33 PM  Result Value Ref Range   hCG Quant 26,307 mIU/mL  Results for orders placed or performed in visit on 07/24/16 (from the past 336 hour(s))  Beta HCG, Quant   Collection Time: 07/24/16 11:06 AM  Result Value Ref Range   hCG Quant 33,541 mIU/mL    EKG: No orders found for this or any previous visit.  Imaging Studies: US Ob Comp Less 14 Wks  Result Date: 07/25/2016 DATING AND VIABILITY SONOGRAM Angelica Henry is a 25 y.o. year old G1P0000 with LMP 05/16/2016  which would correlate to  9+[redacted] weeks gestation.  She has regular menstrual cycles.   She is here today for a confirmatory initial sonogram. GESTATION  TWINS  FETAL ACTIVITY:          Heart rate         No fht          The fetus is inactive. CERVIX: Appears closed ADNEXA: The ovaries are normal. GESTATIONAL AGE AND  BIOMETRICS: Gestational criteria: Estimated Date of Delivery: 02/20/2017. by LMP now at 9+6 wks Previous Scans:0 GESTATIONAL SAC #1 GESTATIONAL SAC #2           34.3 mm            12.0 mm         8+4 weeks           6wks CROWN RUMP LENGTH           4.2 mm         6 weeks                                                                      AVERAGE EGA(BY THIS SCAN):  6 weeks  TECHNICIAN COMMENTS: [place] TA/TV: GS  34.3 mm=8+4 wks w/a 6wks fetal pole,no fht seen,enlarged YS 13.7 mm,second GS 12.0 mm=6 wk,no ys or fetal pole  seen,normal ov's bilat,pt was seen by Dr. Alvy Bimler. A copy of this report including all images has been saved and backed up to a second source for retrieval if needed. All measures and details of the anatomical scan, placentation, fluid volume and pelvic anatomy are contained in that report. Amber Flora Lipps 07/24/2016 10:23 AM Clinical Impression and recommendations:Dating ultrasound I have reviewed the sonogram results above. Combined with the patient's current clinical course, below are my impressions and any appropriate recommendations for management based on the sonographic findings: 1.  Distinctly abnormal early first trimester ultrasound that does not correlate with menstrual history indicating 2 gestational sacs. Sac A 34.3 mm, with atypically enlarged yolk sac. The gestational sac this large with absence of fetal pole indicates nonviable pregnancy. 2. Second sac is significantly smaller, 12 mm and yolk sac is not visible. While this could be consistent with early IUP at 5 weeks, the lack of correlation with menstrual history and other sac suggests nonviable pregnancy. Recommend interval ultrasound in 7-10 days. Tilda Burrow   US Ob Transvaginal  Result Date: 07/25/2016 DATING AND VIABILITY SONOGRAM Angelica Henry is a 25 y.o. year old G1P0000 with LMP 05/16/2016  which would correlate to  9+[redacted] weeks gestation.  She has regular menstrual cycles.   She is here today for a confirmatory initial sonogram. GESTATION  TWINS  FETAL ACTIVITY:          Heart rate         No fht          The fetus is inactive. CERVIX: Appears closed ADNEXA: The ovaries are normal. GESTATIONAL AGE AND  BIOMETRICS: Gestational  criteria: Estimated Date of Delivery: 02/20/2017. by LMP now at 9+6 wks Previous Scans:0 GESTATIONAL SAC #1 GESTATIONAL SAC #2           34.3 mm            12.0 mm         8+4 weeks          6wks CROWN RUMP LENGTH           4.2 mm         6 weeks                                                                      AVERAGE EGA(BY THIS SCAN):  6 weeks  TECHNICIAN COMMENTS: [place] TA/TV: GS  34.3 mm=8+4 wks w/a 6wks fetal pole,no fht seen,enlarged YS 13.7 mm,second GS 12.0 mm=6 wk,no ys or fetal pole seen,normal ov's bilat,pt was seen by Dr. Alvy Bimler. A copy of this report including all images has been saved and backed up to a second source for retrieval if needed. All measures and details of the anatomical scan, placentation, fluid volume and pelvic anatomy are contained in that report. Amber Flora Lipps 07/24/2016 10:23 AM Clinical Impression and recommendations:Dating ultrasound I have reviewed the sonogram results above. Combined with the patient's current clinical course, below are my impressions and any appropriate recommendations for management based on the sonographic findings: 1.  Distinctly abnormal early first trimester ultrasound that does not correlate with menstrual history indicating 2 gestational sacs. Sac A 34.3 mm, with atypically enlarged yolk sac. The gestational sac this large with absence  of fetal pole indicates nonviable pregnancy. 2. Second sac is significantly smaller, 12 mm and yolk sac is not visible. While this could be consistent with early IUP at 5 weeks, the lack of correlation with menstrual history and other sac suggests nonviable pregnancy. Recommend interval ultrasound in 7-10 days. FERGUSON,JOHN V      Assessment: Missed AB Patient Active Problem List   Diagnosis Date Noted  . Dyspareunia 04/19/2015    Plan: Suction sharp curettage 08/02/2016  Elaiza Shoberg H 07/31/2016 11:17 AM

## 2016-07-31 NOTE — Patient Instructions (Signed)
Angelica Henry  07/31/2016     @PREFPERIOPPHARMACY @   Your procedure is scheduled on 9.20.2017.  Report to [facility] at 7:30 [facility].M.  Call this number if you have problems the morning of surgery:  [phone]   Remember:  Do not eat food or drink liquids after midnight.  Take these medicines the morning of surgery with A SIP OF WATER None   Do not wear jewelry, make-up or nail polish.  Do not wear lotions, powders, or perfumes, or deoderant.  Do not shave 48 hours prior to surgery.  Men may shave face and neck.  Do not bring valuables to the hospital.  [facility] is not responsible for any belongings or valuables.  Contacts, dentures or bridgework may not be worn into surgery.  Leave your suitcase in the car.  After surgery it may be brought to your room.  For patients admitted to the hospital, discharge time will be determined by your treatment team.  Patients discharged the day of surgery will not be allowed to drive home.    Please read over the following fact sheets that you were given. Surgical Site Infection Prevention and Anesthesia Post-op Instructions     PATIENT INSTRUCTIONS POST-ANESTHESIA  IMMEDIATELY FOLLOWING SURGERY:  Do not drive or operate machinery for the first twenty four hours after surgery.  Do not make any important decisions for twenty four hours after surgery or while taking narcotic pain medications or sedatives.  If you develop intractable nausea and vomiting or a severe headache please notify your doctor immediately.  FOLLOW-UP:  Please make an appointment with your surgeon as instructed. You do not need to follow up with anesthesia unless specifically instructed to do so.  WOUND CARE INSTRUCTIONS (if applicable):  Keep a dry clean dressing on the anesthesia/puncture wound site if there is drainage.  Once the wound has quit draining you may leave it open to air.  Generally you should leave the bandage intact for twenty four hours unless there is  drainage.  If the epidural site drains for more than 36-48 hours please call the anesthesia department.  QUESTIONS?:  Please feel free to call your physician or the hospital operator if you have any questions, and they will be happy to assist you.      Dilation and Curettage or Vacuum Curettage Dilation and curettage (D&C) and vacuum curettage are minor procedures. A D&C involves stretching (dilation) the cervix and scraping (curettage) the inside lining of the womb (uterus). During a D&C, tissue is gently scraped from the inside lining of the uterus. During a vacuum curettage, the lining and tissue in the uterus are removed with the use of gentle suction.  Curettage may be performed to either diagnose or treat a problem. As a diagnostic procedure, curettage is performed to examine tissues from the uterus. A diagnostic curettage may be performed for the following symptoms:   Irregular bleeding in the uterus.   Bleeding with the development of clots.   Spotting between menstrual periods.   Prolonged menstrual periods.   Bleeding after menopause.   No menstrual period (amenorrhea).   A change in size and shape of the uterus.  As a treatment procedure, curettage may be performed for the following reasons:   Removal of an IUD (intrauterine device).   Removal of retained placenta after giving birth. Retained placenta can cause an infection or bleeding severe enough to require transfusions.   Abortion.   Miscarriage.   Removal of polyps inside the  uterus.   Removal of uncommon types of noncancerous lumps (fibroids).  LET [facility]ALTH CARE PROVIDER KNOW ABOUT:   Any allergies you have.   All medicines you are taking, including vitamins, herbs, eye drops, creams, and over-the-counter medicines.   Previous problems you or members of your family have had with the use of anesthetics.   Any blood disorders you have.   Previous surgeries you have had.   Medical  conditions you have. RISKS AND COMPLICATIONS  Generally, this is a safe procedure. However, as with any procedure, complications can occur. Possible complications include:  Excessive bleeding.   Infection of the uterus.   Damage to the cervix.   Development of scar tissue (adhesions) inside the uterus, later causing abnormal amounts of menstrual bleeding.   Complications from the general anesthetic, if a general anesthetic is used.   Putting a hole (perforation) in the uterus. This is rare.  BEFORE THE PROCEDURE   Eat and drink before the procedure only as directed by your health care provider.   Arrange for someone to take you home.  PROCEDURE  This procedure usually takes about 15-30 minutes.  You will be given one of the following:  A medicine that numbs the area in and around the cervix (local anesthetic).   A medicine to make you sleep through the procedure (general anesthetic).  You will lie on your back with your legs in stirrups.   A warm metal or plastic instrument (speculum) will be placed in your vagina to keep it open and to allow the health care provider to see the cervix.  There are two ways in which your cervix can be softened and dilated. These include:   Taking a medicine.   Having thin rods (laminaria) inserted into your cervix.   A curved tool (curette) will be used to scrape cells from the inside lining of the uterus. In some cases, gentle suction is applied with the curette. The curette will then be removed.  AFTER THE PROCEDURE   You will rest in the recovery area until you are stable and are ready to go home.   You may feel sick to your stomach (nauseous) or throw up (vomit) if you were given a general anesthetic.   You may have a sore throat if a tube was placed in your throat during general anesthesia.   You may have light cramping and bleeding. This may last for 2 days to 2 weeks after the procedure.   Your uterus needs to  make a new lining after the procedure. This may make your next period late.   This information is not intended to replace advice given to you by your health care provider. Make sure you discuss any questions you have with your health care provider.   Document Released: 10/30/2005 Document Revised: 07/02/2013 Document Reviewed: 05/29/2013 Elsevier Interactive Patient Education [employer]2016 [employer]

## 2016-08-02 ENCOUNTER — Encounter (HOSPITAL_COMMUNITY)
Admission: RE | Disposition: A | Discharge status: Home / Self Care | Payer: Self-pay | Source: Ambulatory Visit | Attending: Obstetrics & Gynecology

## 2016-08-02 ENCOUNTER — Ambulatory Visit (HOSPITAL_COMMUNITY): Payer: Managed Care, Other (non HMO) | Admitting: Anesthesiology

## 2016-08-02 ENCOUNTER — Ambulatory Visit (HOSPITAL_COMMUNITY)
Admission: RE | Admit: 2016-08-02 | Discharge: 2016-08-02 | Disposition: A | Discharge status: Home / Self Care | Payer: Managed Care, Other (non HMO) | Source: Ambulatory Visit | Attending: Obstetrics & Gynecology | Admitting: Obstetrics & Gynecology

## 2016-08-02 DIAGNOSIS — F17210 Nicotine dependence, cigarettes, uncomplicated: Secondary | ICD-10-CM | POA: Diagnosis not present

## 2016-08-02 DIAGNOSIS — O021 Missed abortion: Secondary | ICD-10-CM | POA: Diagnosis present

## 2016-08-02 HISTORY — PX: DILATION AND EVACUATION: SHX1459

## 2016-08-02 SURGERY — DILATION AND EVACUATION, UTERUS
Anesthesia: General | Site: Vagina

## 2016-08-02 MED ORDER — MIDAZOLAM HCL 2 MG/2ML IJ SOLN
INTRAMUSCULAR | Status: AC
  Filled 2016-08-02: qty 2

## 2016-08-02 MED ORDER — PROPOFOL 10 MG/ML IV BOLUS
INTRAVENOUS | Status: AC
  Filled 2016-08-02: qty 20

## 2016-08-02 MED ORDER — MIDAZOLAM HCL 2 MG/2ML IJ SOLN
1.0000 mg | INTRAMUSCULAR | Status: DC | PRN
  Administered 2016-08-02: 2 mg via INTRAVENOUS

## 2016-08-02 MED ORDER — 0.9 % SODIUM CHLORIDE (POUR BTL) OPTIME
TOPICAL | Status: DC | PRN
  Administered 2016-08-02: 1000 mL

## 2016-08-02 MED ORDER — PROPOFOL 10 MG/ML IV BOLUS
INTRAVENOUS | Status: DC | PRN
  Administered 2016-08-02: 150 mg via INTRAVENOUS

## 2016-08-02 MED ORDER — LACTATED RINGERS IV SOLN
INTRAVENOUS | Status: DC
  Administered 2016-08-02: 09:00:00 via INTRAVENOUS

## 2016-08-02 MED ORDER — CEFAZOLIN SODIUM-DEXTROSE 2-4 GM/100ML-% IV SOLN
2.0000 g | INTRAVENOUS | Status: AC
  Administered 2016-08-02: 2 g via INTRAVENOUS

## 2016-08-02 MED ORDER — LIDOCAINE HCL (PF) 1 % IJ SOLN
INTRAMUSCULAR | Status: AC
  Filled 2016-08-02: qty 5

## 2016-08-02 MED ORDER — ONDANSETRON HCL 4 MG/2ML IJ SOLN
INTRAMUSCULAR | Status: AC
  Filled 2016-08-02: qty 2

## 2016-08-02 MED ORDER — ONDANSETRON HCL 4 MG/2ML IJ SOLN
INTRAMUSCULAR | Status: DC | PRN
  Administered 2016-08-02: 4 mg via INTRAVENOUS

## 2016-08-02 MED ORDER — CEFAZOLIN SODIUM-DEXTROSE 2-4 GM/100ML-% IV SOLN
INTRAVENOUS | Status: AC
  Filled 2016-08-02: qty 100

## 2016-08-02 MED ORDER — LIDOCAINE HCL 1 % IJ SOLN
INTRAMUSCULAR | Status: DC | PRN
  Administered 2016-08-02: 25 mg via INTRADERMAL

## 2016-08-02 MED ORDER — METHYLERGONOVINE MALEATE 0.2 MG/ML IJ SOLN
INTRAMUSCULAR | Status: AC
  Filled 2016-08-02: qty 1

## 2016-08-02 MED ORDER — KETOROLAC TROMETHAMINE 30 MG/ML IJ SOLN
INTRAMUSCULAR | Status: AC
  Filled 2016-08-02: qty 1

## 2016-08-02 MED ORDER — FENTANYL CITRATE (PF) 100 MCG/2ML IJ SOLN
INTRAMUSCULAR | Status: DC | PRN
  Administered 2016-08-02 (×2): 50 ug via INTRAVENOUS

## 2016-08-02 MED ORDER — KETOROLAC TROMETHAMINE 10 MG PO TABS
10.0000 mg | ORAL_TABLET | Freq: Three times a day (TID) | ORAL | 0 refills | Status: DC | PRN
Start: 2016-08-02 — End: 2016-08-14

## 2016-08-02 MED ORDER — MIDAZOLAM HCL 5 MG/5ML IJ SOLN
INTRAMUSCULAR | Status: DC | PRN
  Administered 2016-08-02: 2 mg via INTRAVENOUS

## 2016-08-02 MED ORDER — RHO D IMMUNE GLOBULIN 1500 UNIT/2ML IJ SOSY
300.0000 ug | PREFILLED_SYRINGE | Freq: Once | INTRAMUSCULAR | Status: DC
  Filled 2016-08-02: qty 2

## 2016-08-02 MED ORDER — FENTANYL CITRATE (PF) 100 MCG/2ML IJ SOLN
INTRAMUSCULAR | Status: AC
  Filled 2016-08-02: qty 2

## 2016-08-02 MED ORDER — METHYLERGONOVINE MALEATE 0.2 MG/ML IJ SOLN
INTRAMUSCULAR | Status: DC | PRN
  Administered 2016-08-02: 0.2 mg via INTRAMUSCULAR

## 2016-08-02 MED ORDER — HYDROMORPHONE HCL 1 MG/ML IJ SOLN: 0.2500 mg | INTRAMUSCULAR | Status: DC | PRN

## 2016-08-02 MED ORDER — KETOROLAC TROMETHAMINE 30 MG/ML IJ SOLN
30.0000 mg | Freq: Once | INTRAMUSCULAR | Status: AC
  Administered 2016-08-02: 30 mg via INTRAVENOUS

## 2016-08-02 MED ORDER — METHYLERGONOVINE MALEATE 0.2 MG PO TABS: 0.2000 mg | ORAL_TABLET | Freq: Four times a day (QID) | ORAL | 0 refills | Status: DC

## 2016-08-02 MED ORDER — HYDROCODONE-ACETAMINOPHEN 5-325 MG PO TABS: 1.0000 | ORAL_TABLET | Freq: Four times a day (QID) | ORAL | 0 refills | Status: DC | PRN

## 2016-08-02 SURGICAL SUPPLY — 30 items
BAG HAMPER (MISCELLANEOUS) ×3 IMPLANT
CLOTH BEACON ORANGE TIMEOUT ST (SAFETY) ×3 IMPLANT
COVER LIGHT HANDLE STERIS (MISCELLANEOUS) ×6 IMPLANT
CURETTE VACUUM 9MM CVD CLR (CANNULA) ×3 IMPLANT
DECANTER SPIKE VIAL GLASS SM (MISCELLANEOUS) ×2 IMPLANT
DRAPE HALF SHEET 40X57 (DRAPES) ×2 IMPLANT
FORMALIN 10 PREFIL 480ML (MISCELLANEOUS) ×3 IMPLANT
GAUZE SPONGE 4X4 16PLY XRAY LF (GAUZE/BANDAGES/DRESSINGS) ×3 IMPLANT
GLOVE BIOGEL PI IND STRL 7.0 (GLOVE) ×1 IMPLANT
GLOVE BIOGEL PI IND STRL 8 (GLOVE) ×1 IMPLANT
GLOVE BIOGEL PI INDICATOR 7.0 (GLOVE) ×2
GLOVE BIOGEL PI INDICATOR 8 (GLOVE) ×2
GLOVE ECLIPSE 8.0 STRL XLNG CF (GLOVE) ×3 IMPLANT
GOWN STRL REUS W/TWL LRG LVL3 (GOWN DISPOSABLE) ×6 IMPLANT
GOWN STRL REUS W/TWL XL LVL3 (GOWN DISPOSABLE) ×3 IMPLANT
KIT BERKELEY 1ST TRIMESTER 3/8 (MISCELLANEOUS) ×3 IMPLANT
KIT ROOM TURNOVER AP CYSTO (KITS) ×3 IMPLANT
MANIFOLD NEPTUNE II (INSTRUMENTS) ×3 IMPLANT
MARKER SKIN DUAL TIP RULER LAB (MISCELLANEOUS) ×3 IMPLANT
NS IRRIG 1000ML POUR BTL (IV SOLUTION) ×3 IMPLANT
PACK BASIC III (CUSTOM PROCEDURE TRAY) ×3
PACK SRG BSC III STRL LF ECLPS (CUSTOM PROCEDURE TRAY) ×1 IMPLANT
PAD ARMBOARD 7.5X6 YLW CONV (MISCELLANEOUS) ×3 IMPLANT
SET BERKELEY SUCTION TUBING (SUCTIONS) ×3 IMPLANT
SYRINGE 10CC LL (SYRINGE) IMPLANT
TOWEL OR 17X26 4PK STRL BLUE (TOWEL DISPOSABLE) ×3 IMPLANT
VACURETTE 10MM (CANNULA) IMPLANT
VACURETTE 12MM (CANNULA) IMPLANT
VACURETTE 14MM (CANNULA) IMPLANT
VACURETTE 8MM (CANNULA) IMPLANT

## 2016-08-02 NOTE — Op Note (Signed)
Preoperative diagnosis:  Missed Ab in the first trimester                                          Possible vanishing twin second sac  Postoperative diagnosis:  Same as above  Procedure:  Cervical dilation with suction and sharp uterine curettage  Surgeon:  Lazaro ArmsEURE,LUTHER H  Anesthesia:  Laryngeal mask airway  Findings:  The patient was known to have a first trimester pregnancy loss by serial quantitative hCG.  Sonogram reveals a second intrauterine sac also which is empty suggesting a vanishing twin, the other sac has a missed AB.  Patient declined conservative management or cytotec management.   As a result she is admitted for a uterine  Evacuation.  Description of operation:  The patient was taken to the operating room and placed in the supine position.  She underwent laryngeal mask airway general anesthesia.  The patient was placed in the dorsal lithotomy position.  The vagina was prepped and draped in the usual sterile fashion.  A Graves speculum was placed.  The anterior cervix was grasped with a single-tooth tenaculum.  The cervix was dilated serially with Hegar dilators.  A #9 curved suction curette was placed in the uterus.  The suction pressure was placed at 55 and several passes were made.  All of the intrauterine contents were removed.  The sharp curette was used x1 to feel uterine crie in all areas.  The patient was given Methergine 0.2 mg IV x1.  There was good hemostasis.  The patient was given Ancef 2 grams preoperatively.  The patient was given Toradol 30 mg IV preoperatively.  Estimated blood loss for the procedure was 100 cc.  The patient was awakened from anesthesia taken to the recovery room in good stable condition.  All counts were correct x3.  EURE,LUTHER H 08/02/2016 10:43 AM

## 2016-08-02 NOTE — Anesthesia Procedure Notes (Signed)
Procedure Name: LMA Insertion Date/Time: 08/02/2016 10:15 AM Performed by: Angelica Henry, Angelica Henry Pre-anesthesia Checklist: Patient identified, Patient being monitored, Emergency Drugs available, Timeout performed and Suction available Patient Re-evaluated:Patient Re-evaluated prior to inductionOxygen Delivery Method: Circle System Utilized Preoxygenation: Pre-oxygenation with 100% oxygen Intubation Type: IV induction Ventilation: Mask ventilation without difficulty LMA: LMA inserted LMA Size: 4.0 Number of attempts: 1 Placement Confirmation: positive ETCO2 and breath sounds checked- equal and bilateral Tube secured with: Tape Dental Injury: Teeth and Oropharynx as per pre-operative assessment

## 2016-08-02 NOTE — Anesthesia Postprocedure Evaluation (Signed)
Anesthesia Post Note  Patient: Angelica Henry  Procedure(s) Performed: Procedure(s) (LRB): CERVICAL DILATATION WITH SUCTION AND SHARP UTERINE CURETTAGE (N/A)  Patient location during evaluation: PACU Anesthesia Type: General Level of consciousness: awake and alert, oriented and patient cooperative Pain management: pain level controlled Vital Signs Assessment: post-procedure vital signs reviewed and stable Respiratory status: spontaneous breathing, nonlabored ventilation and respiratory function stable Cardiovascular status: blood pressure returned to baseline Postop Assessment: no signs of nausea or vomiting Anesthetic complications: no    Last Vitals:  Vitals:   08/02/16 1100 08/02/16 1115  BP: 108/64 103/60  Pulse: (!) 59 60  Resp: 18 19  Temp:      Last Pain:  Vitals:   08/02/16 0825  TempSrc: Oral                 Tiona Ruane J

## 2016-08-02 NOTE — H&P (Signed)
Preoperative History and Physical  Angelica Henry is a 25 y.o. G1P0000 with Patient's last menstrual period was 05/14/2016. admitted for a suction sharp uterine curettage due to a missed ab in the first trimester.  Had a sonogram which was discrepant and HCG levels ordered which are going down As a result this pregnancy is a missed ab There is possibly 2 sacs with 1 sac being empty representing a vanishing twin in 1 sac and a missed ab in the other  PMH:        Past Medical History:  Diagnosis Date  . Asthma   . Dyspareunia 04/19/2015  . Herpes simplex virus (HSV) infection     PSH:    History reviewed. No pertinent surgical history.  POb/GynH:              OB History    Gravida Para Term Preterm AB Living   1 0 0 0 0 0   SAB TAB Ectopic Multiple Live Births   0 0 0 0        SH:         Social History  Substance Use Topics  . Smoking status: Current Every Day Smoker    Packs/day: 0.00    Years: 11.00    Types: Cigarettes  . Smokeless tobacco: Never Used     Comment: smokes 1 cig daily  . Alcohol use No    FH:          Family History  Problem Relation Age of Onset  . Cancer Mother     cervical  . Heart disease Father   . Other Sister     cyst on ovary  . Glaucoma Maternal Grandmother   . Heart disease Maternal Grandmother   . Cirrhosis Maternal Grandfather   . Cancer Maternal Grandfather   . Other Paternal Grandfather     brain aneursym     Allergies:       Allergies  Allergen Reactions  . Penicillins Other (See Comments)    Flushing    Medications:      No current outpatient prescriptions on file.  Review of Systems:   Review of Systems  Constitutional: Negative for fever, chills, weight loss, malaise/fatigue and diaphoresis.  HENT: Negative for hearing loss, ear pain, nosebleeds, congestion, sore throat, neck pain, tinnitus and ear discharge.   Eyes: Negative for blurred vision, double vision,  photophobia, pain, discharge and redness.  Respiratory: Negative for cough, hemoptysis, sputum production, shortness of breath, wheezing and stridor.   Cardiovascular: Negative for chest pain, palpitations, orthopnea, claudication, leg swelling and PND.  Gastrointestinal: Positive for abdominal pain. Negative for heartburn, nausea, vomiting, diarrhea, constipation, blood in stool and melena.  Genitourinary: Negative for dysuria, urgency, frequency, hematuria and flank pain.  Musculoskeletal: Negative for myalgias, back pain, joint pain and falls.  Skin: Negative for itching and rash.  Neurological: Negative for dizziness, tingling, tremors, sensory change, speech change, focal weakness, seizures, loss of consciousness, weakness and headaches.  Endo/Heme/Allergies: Negative for environmental allergies and polydipsia. Does not bruise/bleed easily.  Psychiatric/Behavioral: Negative for depression, suicidal ideas, hallucinations, memory loss and substance abuse. The patient is not nervous/anxious and does not have insomnia.      PHYSICAL EXAM:  Blood pressure 110/60, pulse 74, weight 216 lb (98 kg), last menstrual period 05/14/2016.    Vitals reviewed. Constitutional: She is oriented to person, place, and time. She appears well-developed and well-nourished.  HENT:  Head: Normocephalic and atraumatic.  Right Ear: External ear  normal.  Left Ear: External ear normal.  Nose: Nose normal.  Mouth/Throat: Oropharynx is clear and moist.  Eyes: Conjunctivae and EOM are normal. Pupils are equal, round, and reactive to light. Right eye exhibits no discharge. Left eye exhibits no discharge. No scleral icterus.  Neck: Normal range of motion. Neck supple. No tracheal deviation present. No thyromegaly present.  Cardiovascular: Normal rate, regular rhythm, normal heart sounds and intact distal pulses.  Exam reveals no gallop and no friction rub.   No murmur heard. Respiratory: Effort normal and breath  sounds normal. No respiratory distress. She has no wheezes. She has no rales. She exhibits no tenderness.  GI: Soft. Bowel sounds are normal. She exhibits no distension and no mass. There is tenderness. There is no rebound and no guarding.  Genitourinary:       Vulva is normal without lesions Vagina is pink moist without discharge Cervix normal in appearance and pap is normal Uterus is normal size, contour, position, consistency, mobility, non-tender Adnexa is negative with normal sized ovaries by sonogram  Musculoskeletal: Normal range of motion. She exhibits no edema and no tenderness.  Neurological: She is alert and oriented to person, place, and time. She has normal reflexes. She displays normal reflexes. No cranial nerve deficit. She exhibits normal muscle tone. Coordination normal.  Skin: Skin is warm and dry. No rash noted. No erythema. No pallor.  Psychiatric: She has a normal mood and affect. Her behavior is normal. Judgment and thought content normal.    Labs:      Results for orders placed or performed in visit on 07/27/16 (from the past 336 hour(s))  Beta HCG, Quant   Collection Time: 07/27/16  3:33 PM  Result Value Ref Range   hCG Quant 26,307 mIU/mL  Results for orders placed or performed in visit on 07/24/16 (from the past 336 hour(s))  Beta HCG, Quant   Collection Time: 07/24/16 11:06 AM  Result Value Ref Range   hCG Quant 33,541 mIU/mL    EKG: No orders found for this or any previous visit.  Imaging Studies:  ImagingResults  US Ob Comp Less 14 Wks  Result Date: 07/25/2016 DATING AND VIABILITY SONOGRAM Angelica Henry is a 26 y.o. year old G1P0000 with LMP 05/16/2016  which would correlate to  9+[redacted] weeks gestation.  She has regular menstrual cycles.   She is here today for a confirmatory initial sonogram. GESTATION  TWINS  FETAL ACTIVITY:          Heart rate         No fht          The fetus is inactive. CERVIX: Appears closed ADNEXA: The ovaries are normal.  GESTATIONAL AGE AND  BIOMETRICS: Gestational criteria: Estimated Date of Delivery: 02/20/2017. by LMP now at 9+6 wks Previous Scans:0 GESTATIONAL SAC #1 GESTATIONAL SAC #2           34.3 mm            12.0 mm         8+4 weeks          6wks CROWN RUMP LENGTH           4.2 mm         6 weeks  AVERAGE EGA(BY THIS SCAN):  6 weeks  TECHNICIAN COMMENTS: US TA/TV: GS  34.3 mm=8+4 wks w/a 6wks fetal pole,no fht seen,enlarged YS 13.7 mm,second GS 12.0 mm=6 wk,no ys or fetal pole seen,normal ov's bilat,pt was seen by Dr. Alvy BimlerErue. A copy of this report including all images has been saved and backed up to a second source for retrieval if needed. All measures and details of the anatomical scan, placentation, fluid volume and pelvic anatomy are contained in that report. Amber Flora LippsJ Carl 07/24/2016 10:23 AM Clinical Impression and recommendations:Dating ultrasound I have reviewed the sonogram results above. Combined with the patient's current clinical course, below are my impressions and any appropriate recommendations for management based on the sonographic findings: 1.  Distinctly abnormal early first trimester ultrasound that does not correlate with menstrual history indicating 2 gestational sacs. Sac A 34.3 mm, with atypically enlarged yolk sac. The gestational sac this large with absence of fetal pole indicates nonviable pregnancy. 2. Second sac is significantly smaller, 12 mm and yolk sac is not visible. While this could be consistent with early IUP at 5 weeks, the lack of correlation with menstrual history and other sac suggests nonviable pregnancy. Recommend interval ultrasound in 7-10 days. Tilda BurrowFERGUSON,JOHN V   [place] Ob Transvaginal  Result Date: 07/25/2016 DATING AND VIABILITY SONOGRAM Angelica Henry is a 25 y.o. year old G1P0000 with LMP 05/16/2016  which would correlate to  9+[redacted] weeks gestation.  She has regular menstrual cycles.   She is here today for a  confirmatory initial sonogram. GESTATION  TWINS  FETAL ACTIVITY:          Heart rate         No fht          The fetus is inactive. CERVIX: Appears closed ADNEXA: The ovaries are normal. GESTATIONAL AGE AND  BIOMETRICS: Gestational criteria: Estimated Date of Delivery: 02/20/2017. by LMP now at 9+6 wks Previous Scans:0 GESTATIONAL SAC #1 GESTATIONAL SAC #2           34.3 mm            12.0 mm         8+4 weeks          6wks CROWN RUMP LENGTH           4.2 mm         6 weeks                                                                      AVERAGE EGA(BY THIS SCAN):  6 weeks  TECHNICIAN COMMENTS: US TA/TV: GS  34.3 mm=8+4 wks w/a 6wks fetal pole,no fht seen,enlarged YS 13.7 mm,second GS 12.0 mm=6 wk,no ys or fetal pole seen,normal ov's bilat,pt was seen by Dr. Alvy BimlerErue. A copy of this report including all images has been saved and backed up to a second source for retrieval if needed. All measures and details of the anatomical scan, placentation, fluid volume and pelvic anatomy are contained in that report. Amber Flora LippsJ Carl 07/24/2016 10:23 AM Clinical Impression and recommendations:Dating ultrasound I have reviewed the sonogram results above. Combined with the patient's current clinical course, below are my impressions and any appropriate recommendations for management based on the sonographic findings: 1.  Distinctly abnormal early first trimester ultrasound that does not correlate with menstrual history indicating 2 gestational sacs. Sac A 34.3 mm, with atypically enlarged yolk sac. The gestational sac this large with absence of fetal pole indicates nonviable pregnancy. 2. Second sac is significantly smaller, 12 mm and yolk sac is not visible. While this could be consistent with early IUP at 5 weeks, the lack of correlation with menstrual history and other sac suggests nonviable pregnancy. Recommend interval ultrasound in 7-10 days. FERGUSON,JOHN V       Assessment: Missed AB Possible twin gestation with 1  vanishing twin      Patient Active Problem List   Diagnosis Date Noted  . Dyspareunia 04/19/2015    Plan: Suction sharp curettage 08/02/2016  EURE,LUTHER H 07/31/2016 11:17 AM

## 2016-08-02 NOTE — Transfer of Care (Signed)
Immediate Anesthesia Transfer of Care Note  Patient: Marliss CzarShea A Bolar  Procedure(s) Performed: Procedure(s): CERVICAL DILATATION WITH SUCTION AND SHARP UTERINE CURETTAGE (N/A)  Patient Location: PACU  Anesthesia Type:General  Level of Consciousness: awake, oriented and patient cooperative  Airway & Oxygen Therapy: Patient Spontanous Breathing and Patient connected to face mask oxygen  Post-op Assessment: Report given to RN, Post -op Vital signs reviewed and stable and Patient moving all extremities  Post vital signs: Reviewed and stable  Last Vitals:  Vitals:   08/02/16 0900 08/02/16 1000  BP: 113/68 110/62  Pulse:    Resp: 20 (!) 22  Temp:      Last Pain:  Vitals:   08/02/16 0825  TempSrc: Oral      Patients Stated Pain Goal: 8 (08/02/16 0825)  Complications: No apparent anesthesia complications

## 2016-08-02 NOTE — OR Nursing (Signed)
Up to bathroom to void. Assisted to bathroom

## 2016-08-02 NOTE — Interval H&P Note (Signed)
History and Physical Interval Note:  08/02/2016 9:44 AM  Angelica Henry  has presented today for surgery, with the diagnosis of missed abortion  The various methods of treatment have been discussed with the patient and family. After consideration of risks, benefits and other options for treatment, the patient has consented to  Procedure(s): DILATATION AND EVACUATION (N/A) as a surgical intervention .  The patient's history has been reviewed, patient examined, no change in status, stable for surgery.  I have reviewed the patient's chart and labs.  Questions were answered to the patient's satisfaction.     EURE,LUTHER H

## 2016-08-02 NOTE — Discharge Instructions (Signed)
Dilation and Curettage or Vacuum Curettage, Care After Refer to this sheet in the next few weeks. These instructions provide you with information on caring for yourself after your procedure. Your health care provider may also give you more specific instructions. Your treatment has been planned according to current medical practices, but problems sometimes occur. Call your health care provider if you have any problems or questions after your procedure. WHAT TO EXPECT AFTER THE PROCEDURE After your procedure, it is typical to have light cramping and bleeding. This may last for 2 days to 2 weeks after the procedure. HOME CARE INSTRUCTIONS   Do not drive for 24 hours.  Wait 1 week before returning to strenuous activities.  Take your temperature 2 times a day for 4 days and write it down. Provide these temperatures to your health care provider if you develop a fever.  Avoid long periods of standing.  Avoid heavy lifting, pushing, or pulling. Do not lift anything heavier than 10 pounds (4.5 kg).  Limit stair climbing to once or twice a day.  Take rest periods often.  You may resume your usual diet.  Drink enough fluids to keep your urine clear or pale yellow.  Your usual bowel function should return. If you have constipation, you may:  Take a mild laxative with permission from your health care provider.  Add fruit and bran to your diet.  Drink more fluids.  Take showers instead of baths until your health care provider gives you permission to take baths.  Do not go swimming or use a hot tub until your health care provider approves.  Try to have someone with you or available to you the first 24-48 hours, especially if you were given a general anesthetic.  Do not douche, use tampons, or have sex (intercourse) for 2 weeks after the procedure.  Only take over-the-counter or prescription medicines as directed by your health care provider. Do not take aspirin. It can cause  bleeding.  Follow up with your health care provider as directed. SEEK MEDICAL CARE IF:   You have increasing cramps or pain that is not relieved with medicine.  You have abdominal pain that does not seem to be related to the same area of earlier cramping and pain.  You have bad smelling vaginal discharge.  You have a rash.  You are having problems with any medicine. SEEK IMMEDIATE MEDICAL CARE IF:   You have bleeding that is heavier than a normal menstrual period.  You have a fever.  You have chest pain.  You have shortness of breath.  You feel dizzy or feel like fainting.  You pass out.  You have pain in your shoulder strap area.  You have heavy vaginal bleeding with or without blood clots. MAKE SURE YOU:   Understand these instructions.  Will watch your condition.  Will get help right away if you are not doing well or get worse.   This information is not intended to replace advice given to you by your health care provider. Make sure you discuss any questions you have with your health care provider.   Document Released: 10/27/2000 Document Revised: 11/04/2013 Document Reviewed: 05/29/2013 Elsevier Interactive Patient Education 2016 Else[employer] Inc.  

## 2016-08-02 NOTE — Addendum Note (Signed)
Addendum  created 08/02/16 1144 by Heather RobertsJames Dare Spillman, MD   Sign clinical note

## 2016-08-02 NOTE — Anesthesia Preprocedure Evaluation (Addendum)
Anesthesia Evaluation  Patient identified by MRN, date of birth, ID band Patient awake    Reviewed: Allergy & Precautions, NPO status , Patient's Chart, lab work & pertinent test results  Airway Mallampati: II  TM Distance: >3 FB Neck ROM: Full    Dental no notable dental hx. (+) Dental Advisory Given, Teeth Intact   Pulmonary asthma , Current Smoker,    Pulmonary exam normal        Cardiovascular negative cardio ROS Normal cardiovascular exam     Neuro/Psych negative neurological ROS  negative psych ROS   GI/Hepatic negative GI ROS, Neg liver ROS,   Endo/Other  negative endocrine ROS  Renal/GU negative Renal ROS  negative genitourinary   Musculoskeletal negative musculoskeletal ROS (+)   Abdominal   Peds negative pediatric ROS (+)  Hematology negative hematology ROS (+)   Anesthesia Other Findings   Reproductive/Obstetrics (+) Pregnancy                            Anesthesia Physical Anesthesia Plan  ASA: II  Anesthesia Plan: General   Post-op Pain Management:    Induction: Intravenous  Airway Management Planned: LMA  Additional Equipment:   Intra-op Plan:   Post-operative Plan: Extubation in OR  Informed Consent: I have reviewed the patients History and Physical, chart, labs and discussed the procedure including the risks, benefits and alternatives for the proposed anesthesia with the patient or authorized representative who has indicated his/her understanding and acceptance.   Dental advisory given  Plan Discussed with: Anesthesiologist and CRNA  Anesthesia Plan Comments:         Anesthesia Quick Evaluation

## 2016-08-02 NOTE — Anesthesia Postprocedure Evaluation (Signed)
Anesthesia Post Note  Patient: Angelica Henry  Procedure(s) Performed: Procedure(s) (LRB): CERVICAL DILATATION WITH SUCTION AND SHARP UTERINE CURETTAGE (N/A)  Patient location during evaluation: PACU Anesthesia Type: General Level of consciousness: sedated Pain management: pain level controlled Vital Signs Assessment: post-procedure vital signs reviewed and stable Respiratory status: spontaneous breathing and respiratory function stable Cardiovascular status: stable Anesthetic complications: no    Last Vitals:  Vitals:   08/02/16 1100 08/02/16 1115  BP: 108/64 103/60  Pulse: (!) 59 60  Resp: 18 19  Temp:      Last Pain:  Vitals:   08/02/16 0825  TempSrc: Oral                 Anni Hocevar DANIEL

## 2016-08-03 LAB — RH IG WORKUP (INCLUDES ABO/RH)
ABO/RH(D): A NEG
GESTATIONAL AGE(WKS): 9
UNIT DIVISION: 0

## 2016-08-07 DIAGNOSIS — Z029 Encounter for administrative examinations, unspecified: Secondary | ICD-10-CM

## 2016-08-08 ENCOUNTER — Encounter (HOSPITAL_COMMUNITY): Payer: Self-pay | Admitting: Obstetrics & Gynecology

## 2016-08-14 ENCOUNTER — Encounter: Payer: Self-pay | Admitting: Adult Health

## 2016-08-14 ENCOUNTER — Encounter: Payer: Managed Care, Other (non HMO) | Admitting: Obstetrics & Gynecology

## 2016-08-14 ENCOUNTER — Ambulatory Visit (INDEPENDENT_AMBULATORY_CARE_PROVIDER_SITE_OTHER): Payer: Managed Care, Other (non HMO) | Admitting: Adult Health

## 2016-08-14 VITALS — BP 110/78 | HR 60 | Ht 65.0 in | Wt 216.4 lb

## 2016-08-14 DIAGNOSIS — F32A Depression, unspecified: Secondary | ICD-10-CM | POA: Insufficient documentation

## 2016-08-14 DIAGNOSIS — F329 Major depressive disorder, single episode, unspecified: Secondary | ICD-10-CM | POA: Diagnosis not present

## 2016-08-14 DIAGNOSIS — Z87898 Personal history of other specified conditions: Secondary | ICD-10-CM

## 2016-08-14 MED ORDER — ESCITALOPRAM OXALATE 10 MG PO TABS: 10.0000 mg | ORAL_TABLET | Freq: Every day | ORAL | 3 refills | Status: DC

## 2016-08-14 NOTE — Patient Instructions (Addendum)
Take lexapro 10 mg daily Follow up in 3 weeks  Postpartum Depression and Baby Blues The postpartum period begins right after the birth of a baby. During this time, there is often a great amount of joy and excitement. It is also a time of many changes in the life of the parents. Regardless of how many times a mother gives birth, each child brings new challenges and dynamics to the family. It is not unusual to have feelings of excitement along with confusing shifts in moods, emotions, and thoughts. All mothers are at risk of developing postpartum depression or the "baby blues." These mood changes can occur right after giving birth, or they may occur many months after giving birth. The baby blues or postpartum depression can be mild or severe. Additionally, postpartum depression can go away rather quickly, or it can be a long-term condition.  CAUSES Raised hormone levels and the rapid drop in those levels are thought to be a main cause of postpartum depression and the baby blues. A number of hormones change during and after pregnancy. Estrogen and progesterone usually decrease right after the delivery of your baby. The levels of thyroid hormone and various cortisol steroids also rapidly drop. Other factors that play a role in these mood changes include major life events and genetics.  RISK FACTORS If you have any of the following risks for the baby blues or postpartum depression, know what symptoms to watch out for during the postpartum period. Risk factors that may increase the likelihood of getting the baby blues or postpartum depression include:  Having a personal or family history of depression.   Having depression while being pregnant.   Having premenstrual mood issues or mood issues related to oral contraceptives.  Having a lot of life stress.   Having marital conflict.   Lacking a social support network.   Having a baby with special needs.   Having health problems, such as diabetes.   SIGNS AND SYMPTOMS Symptoms of baby blues include:  Brief changes in mood, such as going from extreme happiness to sadness.  Decreased concentration.   Difficulty sleeping.   Crying spells, tearfulness.   Irritability.   Anxiety.  Symptoms of postpartum depression typically begin within the first month after giving birth. These symptoms include:  Difficulty sleeping or excessive sleepiness.   Marked weight loss.   Agitation.   Feelings of worthlessness.   Lack of interest in activity or food.  Postpartum psychosis is a very serious condition and can be dangerous. Fortunately, it is rare. Displaying any of the following symptoms is cause for immediate medical attention. Symptoms of postpartum psychosis include:   Hallucinations and delusions.   Bizarre or disorganized behavior.   Confusion or disorientation.  DIAGNOSIS  A diagnosis is made by an evaluation of your symptoms. There are no medical or lab tests that lead to a diagnosis, but there are various questionnaires that a health care provider may use to identify those with the baby blues, postpartum depression, or psychosis. Often, a screening tool called the [place] Postnatal Depression Scale is used to diagnose depression in the postpartum period.  TREATMENT The baby blues usually goes away on its own in 1-2 weeks. Social support is often all that is needed. You will be encouraged to get adequate sleep and rest. Occasionally, you may be given medicines to help you sleep.  Postpartum depression requires treatment because it can last several months or longer if it is not treated. Treatment may include individual or group  therapy, medicine, or both to address any social, physiological, and psychological factors that may play a role in the depression. Regular exercise, a healthy diet, rest, and social support may also be strongly recommended.  Postpartum psychosis is more serious and needs treatment right away.  Hospitalization is often needed. HOME CARE INSTRUCTIONS  Get as much rest as you can. Nap when the baby sleeps.   Exercise regularly. Some women find yoga and walking to be beneficial.   Eat a balanced and nourishing diet.   Do little things that you enjoy. Have a cup of tea, take a bubble bath, read your favorite magazine, or listen to your favorite music.  Avoid alcohol.   Ask for help with household chores, cooking, grocery shopping, or running errands as needed. Do not try to do everything.   Talk to people close to you about how you are feeling. Get support from your partner, family members, friends, or other new moms.  Try to stay positive in how you think. Think about the things you are grateful for.   Do not spend a lot of time alone.   Only take over-the-counter or prescription medicine as directed by your health care provider.  Keep all your postpartum appointments.   Let your health care provider know if you have any concerns.  SEEK MEDICAL CARE IF: You are having a reaction to or problems with your medicine. SEEK IMMEDIATE MEDICAL CARE IF:  You have suicidal feelings.   You think you may harm the baby or someone else. MAKE SURE YOU:  Understand these instructions.  Will watch your condition.  Will get help right away if you are not doing well or get worse.   This information is not intended to replace advice given to you by your health care provider. Make sure you discuss any questions you have with your health care provider.   Document Released: 08/03/2004 Document Revised: 11/04/2013 Document Reviewed: 08/11/2013 Elsevier Interactive Patient Education [employer]2016 Else[employer] Inc.

## 2016-08-14 NOTE — Progress Notes (Signed)
Subjective:     Patient ID: Angelica Henry, female   DOB: 12/01/1990, 25 y.o.   MRN: [id number]  HPI Angelica Henry is a a 25 year old white female in for a post op exam after D&C 9/20 for missed abortion.   Review of Systems Patient denies any headaches, hearing loss, fatigue, blurred vision, shortness of breath, chest pain, abdominal pain, problems with bowel movements, urination, or intercourse.Has cough and nasal congestion. No joint pain or mood swings. Reviewed past medical,surgical, social and family history. Reviewed medications and allergies.     Objective:   Physical Exam BP 110/78   Pulse 60   Ht 5\' 5"  (1.651 m)   Wt 216 lb 6.4 oz (98.2 kg)   LMP 08/08/2016   BMI 36.01 kg/m  Skin warm and dry. Lungs: clear to ausculation bilaterally. Cardiovascular: regular rate and rhythm. Pelvic: external genitalia is normal in appearance no lesions, vagina: pink with good mositure and rugae,urethra has no lesions or masses noted, cervix:smooth and bulbous, uterus: normal size, shape and contour, non tender, no masses felt, adnexa: no masses or tenderness noted. Bladder is non tender and no masses felt. 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     Assessment:     1. No post-op complications   2. Reactive depression       Plan:     Meds ordered this encounter  Medications  . escitalopram (LEXAPRO) 10 MG tablet    Sig: Take 1 tablet (10 mg total) by mouth daily.    Dispense:  30 tablet    Refill:  3    Order Specific Question:   Supervising Provider    Answer:   Duane LopeEURE, LUTHER H [[id number]]  Follow up in 3 weeks or before if needed  Referred to [facility]

## 2016-08-15 ENCOUNTER — Telehealth: Payer: Self-pay | Admitting: *Deleted

## 2016-08-15 NOTE — Telephone Encounter (Signed)
No answer,

## 2016-09-04 ENCOUNTER — Ambulatory Visit: Payer: Managed Care, Other (non HMO) | Admitting: Adult Health

## 2016-12-11 ENCOUNTER — Encounter: Payer: Self-pay | Admitting: Adult Health

## 2016-12-11 ENCOUNTER — Ambulatory Visit (INDEPENDENT_AMBULATORY_CARE_PROVIDER_SITE_OTHER): Payer: Medicaid Other | Admitting: Adult Health

## 2016-12-11 VITALS — BP 110/60 | HR 66 | Ht 64.0 in | Wt 215.5 lb

## 2016-12-11 DIAGNOSIS — Z3490 Encounter for supervision of normal pregnancy, unspecified, unspecified trimester: Secondary | ICD-10-CM

## 2016-12-11 DIAGNOSIS — N926 Irregular menstruation, unspecified: Secondary | ICD-10-CM

## 2016-12-11 DIAGNOSIS — R112 Nausea with vomiting, unspecified: Secondary | ICD-10-CM | POA: Diagnosis not present

## 2016-12-11 DIAGNOSIS — O3680X0 Pregnancy with inconclusive fetal viability, not applicable or unspecified: Secondary | ICD-10-CM

## 2016-12-11 DIAGNOSIS — Z3201 Encounter for pregnancy test, result positive: Secondary | ICD-10-CM | POA: Diagnosis not present

## 2016-12-11 LAB — POCT URINE PREGNANCY: Preg Test, Ur: POSITIVE — AB

## 2016-12-11 MED ORDER — PRENATAL VITAMINS 0.8 MG PO TABS: 1.0000 | ORAL_TABLET | Freq: Every day | ORAL | 12 refills | Status: AC

## 2016-12-11 NOTE — Patient Instructions (Signed)
First Trimester of Pregnancy The first trimester of pregnancy is from week 1 until the end of week 12 (months 1 through 3). A week after a sperm fertilizes an egg, the egg will implant on the wall of the uterus. This embryo will begin to develop into a baby. Genes from you and your partner are forming the baby. The female genes determine whether the baby is a boy or a girl. At 6-8 weeks, the eyes and face are formed, and the heartbeat can be seen on ultrasound. At the end of 12 weeks, all the baby's organs are formed.  Now that you are pregnant, you will want to do everything you can to have a healthy baby. Two of the most important things are to get good prenatal care and to follow your health care provider's instructions. Prenatal care is all the medical care you receive before the baby's birth. This care will help prevent, find, and treat any problems during the pregnancy and childbirth. BODY CHANGES Your body goes through many changes during pregnancy. The changes vary from woman to woman.   You may gain or lose a couple of pounds at first.  You may feel sick to your stomach (nauseous) and throw up (vomit). If the vomiting is uncontrollable, call your health care provider.  You may tire easily.  You may develop headaches that can be relieved by medicines approved by your health care provider.  You may urinate more often. Painful urination may mean you have a bladder infection.  You may develop heartburn as a result of your pregnancy.  You may develop constipation because certain hormones are causing the muscles that push waste through your intestines to slow down.  You may develop hemorrhoids or swollen, bulging veins (varicose veins).  Your breasts may begin to grow larger and become tender. Your nipples may stick out more, and the tissue that surrounds them (areola) may become darker.  Your gums may bleed and may be sensitive to brushing and flossing.  Dark spots or blotches (chloasma,  mask of pregnancy) may develop on your face. This will likely fade after the baby is born.  Your menstrual periods will stop.  You may have a loss of appetite.  You may develop cravings for certain kinds of food.  You may have changes in your emotions from day to day, such as being excited to be pregnant or being concerned that something may go wrong with the pregnancy and baby.  You may have more vivid and strange dreams.  You may have changes in your hair. These can include thickening of your hair, rapid growth, and changes in texture. Some women also have hair loss during or after pregnancy, or hair that feels dry or thin. Your hair will most likely return to normal after your baby is born. WHAT TO EXPECT AT YOUR PRENATAL VISITS During a routine prenatal visit:  You will be weighed to make sure you and the baby are growing normally.  Your blood pressure will be taken.  Your abdomen will be measured to track your baby's growth.  The fetal heartbeat will be listened to starting around week 10 or 12 of your pregnancy.  Test results from any previous visits will be discussed. Your health care provider may ask you:  How you are feeling.  If you are feeling the baby move.  If you have had any abnormal symptoms, such as leaking fluid, bleeding, severe headaches, or abdominal cramping.  If you are using any tobacco products,   including cigarettes, chewing tobacco, and electronic cigarettes.  If you have any questions. Other tests that may be performed during your first trimester include:  Blood tests to find your blood type and to check for the presence of any previous infections. They will also be used to check for low iron levels (anemia) and Rh antibodies. Later in the pregnancy, blood tests for diabetes will be done along with other tests if problems develop.  Urine tests to check for infections, diabetes, or protein in the urine.  An ultrasound to confirm the proper growth  and development of the baby.  An amniocentesis to check for possible genetic problems.  Fetal screens for spina bifida and Down syndrome.  You may need other tests to make sure you and the baby are doing well.  HIV (human immunodeficiency virus) testing. Routine prenatal testing includes screening for HIV, unless you choose not to have this test. HOME CARE INSTRUCTIONS  Medicines   Follow your health care provider's instructions regarding medicine use. Specific medicines may be either safe or unsafe to take during pregnancy.  Take your prenatal vitamins as directed.  If you develop constipation, try taking a stool softener if your health care provider approves. Diet   Eat regular, well-balanced meals. Choose a variety of foods, such as meat or vegetable-based protein, fish, milk and low-fat dairy products, vegetables, fruits, and whole grain breads and cereals. Your health care provider will help you determine the amount of weight gain that is right for you.  Avoid raw meat and uncooked cheese. These carry germs that can cause birth defects in the baby.  Eating four or five small meals rather than three large meals a day may help relieve nausea and vomiting. If you start to feel nauseous, eating a few soda crackers can be helpful. Drinking liquids between meals instead of during meals also seems to help nausea and vomiting.  If you develop constipation, eat more high-fiber foods, such as fresh vegetables or fruit and whole grains. Drink enough fluids to keep your urine clear or pale yellow. Activity and Exercise   Exercise only as directed by your health care provider. Exercising will help you:  Control your weight.  Stay in shape.  Be prepared for labor and delivery.  Experiencing pain or cramping in the lower abdomen or low back is a good sign that you should stop exercising. Check with your health care provider before continuing normal exercises.  Try to avoid standing for  long periods of time. Move your legs often if you must stand in one place for a long time.  Avoid heavy lifting.  Wear low-heeled shoes, and practice good posture.  You may continue to have sex unless your health care provider directs you otherwise. Relief of Pain or Discomfort   Wear a good support bra for breast tenderness.   Take warm sitz baths to soothe any pain or discomfort caused by hemorrhoids. Use hemorrhoid cream if your health care provider approves.   Rest with your legs elevated if you have leg cramps or low back pain.  If you develop varicose veins in your legs, wear support hose. Elevate your feet for 15 minutes, 3-4 times a day. Limit salt in your diet. Prenatal Care   Schedule your prenatal visits by the twelfth week of pregnancy. They are usually scheduled monthly at first, then more often in the last 2 months before delivery.  Write down your questions. Take them to your prenatal visits.  Keep all your prenatal  visits as directed by your health care provider. Safety   Wear your seat belt at all times when driving.  Make a list of emergency phone numbers, including numbers for family, friends, the hospital, and police and fire departments. General Tips   Ask your health care provider for a referral to a local prenatal education class. Begin classes no later than at the beginning of month 6 of your pregnancy.  Ask for help if you have counseling or nutritional needs during pregnancy. Your health care provider can offer advice or refer you to specialists for help with various needs.  Do not use hot tubs, steam rooms, or saunas.  Do not douche or use tampons or scented sanitary pads.  Do not cross your legs for long periods of time.  Avoid cat litter boxes and soil used by cats. These carry germs that can cause birth defects in the baby and possibly loss of the fetus by miscarriage or stillbirth.  Avoid all smoking, herbs, alcohol, and medicines not  prescribed by your health care provider. Chemicals in these affect the formation and growth of the baby.  Do not use any tobacco products, including cigarettes, chewing tobacco, and electronic cigarettes. If you need help quitting, ask your health care provider. You may receive counseling support and other resources to help you quit.  Schedule a dentist appointment. At home, brush your teeth with a soft toothbrush and be gentle when you floss. SEEK MEDICAL CARE IF:   You have dizziness.  You have mild pelvic cramps, pelvic pressure, or nagging pain in the abdominal area.  You have persistent nausea, vomiting, or diarrhea.  You have a bad smelling vaginal discharge.  You have pain with urination.  You notice increased swelling in your face, hands, legs, or ankles. SEEK IMMEDIATE MEDICAL CARE IF:   You have a fever.  You are leaking fluid from your vagina.  You have spotting or bleeding from your vagina.  You have severe abdominal cramping or pain.  You have rapid weight gain or loss.  You vomit blood or material that looks like coffee grounds.  You are exposed to [place] measles and have never had them.  You are exposed to fifth disease or chickenpox.  You develop a severe headache.  You have shortness of breath.  You have any kind of trauma, such as from a fall or a car accident. This information is not intended to replace advice given to you by your health care provider. Make sure you discuss any questions you have with your health care provider. Document Released: 10/24/2001 Document Revised: 11/20/2014 Document Reviewed: 09/09/2013 Elsevier Interactive Patient Education  2017 [employer] Eat often US in 1 week

## 2016-12-11 NOTE — Progress Notes (Signed)
Subjective:     Patient ID: Angelica Henry, female   DOB: 09/16/1991, 26 y.o.   MRN: [id number]  HPI Angelica Henry is a 26 year old white female in for UPT, has missed a period and had 5+HPT, and has nausea and vomiting, no bleeding  Review of Systems +missed period +N/V No bleeding Reviewed past medical,surgical, social and family history. Reviewed medications and allergies.     Objective:   Physical Exam BP 110/60 (BP Location: Left Arm, Patient Position: Sitting, Cuff Size: Large)   Pulse 66   Ht 5\' 4"  (1.626 m)   Wt 215 lb 8 oz (97.8 kg)   LMP 10/11/2016 (Approximate)   BMI 36.99 kg/m +UPT, about 8+5 weeks by LMP with EDD 07/18/17.Skin warm and dry. Neck: mid line trachea, normal thyroid, good ROM, no lymphadenopathy noted. Lungs: clear to ausculation bilaterally. Cardiovascular: regular rate and rhythm.Abdomen is soft and non tender.PHQ 2 score 0.    Assessment:     1. Pregnancy examination or test, positive result   2. Pregnancy, unspecified gestational age   403. Encounter to determine fetal viability of pregnancy, single or unspecified fetus       Plan:    Eat often  Continue OTC PNV Return in 1 week for dating US Review handout on first trimester

## 2016-12-18 ENCOUNTER — Other Ambulatory Visit: Payer: Self-pay | Admitting: Obstetrics & Gynecology

## 2016-12-18 ENCOUNTER — Ambulatory Visit (INDEPENDENT_AMBULATORY_CARE_PROVIDER_SITE_OTHER): Payer: Medicaid Other

## 2016-12-18 DIAGNOSIS — O3680X0 Pregnancy with inconclusive fetal viability, not applicable or unspecified: Secondary | ICD-10-CM | POA: Diagnosis not present

## 2016-12-18 DIAGNOSIS — Z3682 Encounter for antenatal screening for nuchal translucency: Secondary | ICD-10-CM

## 2016-12-18 DIAGNOSIS — Z3A11 11 weeks gestation of pregnancy: Secondary | ICD-10-CM | POA: Diagnosis not present

## 2016-12-18 NOTE — Progress Notes (Addendum)
US 11+1 wks,single IUP pos fht 158 bpm,normal ov's bilat,crl 50.2 mm,EDD 07/08/2017 by LMP

## 2017-01-01 ENCOUNTER — Ambulatory Visit (INDEPENDENT_AMBULATORY_CARE_PROVIDER_SITE_OTHER): Payer: Medicaid Other | Admitting: Women's Health

## 2017-01-01 ENCOUNTER — Encounter: Payer: Self-pay | Admitting: Women's Health

## 2017-01-01 ENCOUNTER — Ambulatory Visit (INDEPENDENT_AMBULATORY_CARE_PROVIDER_SITE_OTHER): Payer: Medicaid Other

## 2017-01-01 VITALS — BP 110/60 | HR 62 | Wt 213.0 lb

## 2017-01-01 DIAGNOSIS — O210 Mild hyperemesis gravidarum: Secondary | ICD-10-CM

## 2017-01-01 DIAGNOSIS — Z3482 Encounter for supervision of other normal pregnancy, second trimester: Secondary | ICD-10-CM

## 2017-01-01 DIAGNOSIS — Z3A13 13 weeks gestation of pregnancy: Secondary | ICD-10-CM

## 2017-01-01 DIAGNOSIS — O99331 Smoking (tobacco) complicating pregnancy, first trimester: Secondary | ICD-10-CM | POA: Diagnosis not present

## 2017-01-01 DIAGNOSIS — O26891 Other specified pregnancy related conditions, first trimester: Secondary | ICD-10-CM

## 2017-01-01 DIAGNOSIS — Z3682 Encounter for antenatal screening for nuchal translucency: Secondary | ICD-10-CM | POA: Diagnosis not present

## 2017-01-01 DIAGNOSIS — Z331 Pregnant state, incidental: Secondary | ICD-10-CM

## 2017-01-01 DIAGNOSIS — O360111 Maternal care for anti-D [Rh] antibodies, first trimester, fetus 1: Secondary | ICD-10-CM

## 2017-01-01 DIAGNOSIS — Z3490 Encounter for supervision of normal pregnancy, unspecified, unspecified trimester: Secondary | ICD-10-CM | POA: Insufficient documentation

## 2017-01-01 DIAGNOSIS — F17200 Nicotine dependence, unspecified, uncomplicated: Secondary | ICD-10-CM | POA: Insufficient documentation

## 2017-01-01 DIAGNOSIS — Z6791 Unspecified blood type, Rh negative: Secondary | ICD-10-CM | POA: Insufficient documentation

## 2017-01-01 DIAGNOSIS — O26899 Other specified pregnancy related conditions, unspecified trimester: Secondary | ICD-10-CM

## 2017-01-01 DIAGNOSIS — Z1389 Encounter for screening for other disorder: Secondary | ICD-10-CM

## 2017-01-01 NOTE — Progress Notes (Signed)
US 13+1 wks,measurements c/w dates,crl 73.9 mm,fhr 168 bpm,NB present,NT 1.7 mm

## 2017-01-01 NOTE — Patient Instructions (Signed)
Nausea & Vomiting  Have saltine crackers or pretzels by your bed and eat a few bites before you raise your head out of bed in the morning  Eat small frequent meals throughout the day instead of large meals  Drink plenty of fluids throughout the day to stay hydrated, just don't drink a lot of fluids with your meals.  This can make your stomach fill up faster making you feel sick  Do not brush your teeth right after you eat  Products with real ginger are good for nausea, like ginger ale and ginger hard candy Make sure it says made with real ginger!  Sucking on sour candy like lemon heads is also good for nausea  If your prenatal vitamins make you nauseated, take them at night so you will sleep through the nausea  Sea Bands  If you feel like you need medicine for the nausea & vomiting please let us know  If you are unable to keep any fluids or food down please let us know   Constipation  Drink plenty of fluid, preferably water, throughout the day  Eat foods high in fiber such as fruits, vegetables, and grains  Exercise, such as walking, is a good way to keep your bowels regular  Drink warm fluids, especially warm prune juice, or decaf coffee  Eat a 1/2 cup of real oatmeal (not instant), 1/2 cup applesauce, and 1/2-1 cup warm prune juice every day  If needed, you may take Colace (docusate sodium) stool softener once or twice a day to help keep the stool soft. If you are pregnant, wait until you are out of your first trimester (12-14 weeks of pregnancy)  If you still are having problems with constipation, you may take Miralax once daily as needed to help keep your bowels regular.  If you are pregnant, wait until you are out of your first trimester (12-14 weeks of pregnancy)  For Headaches:   Stay well hydrated, drink enough water so that your urine is clear, sometimes if you are dehydrated you can get headaches  Eat small frequent meals and snacks, sometimes if you are hungry you  can get headaches  Sometimes you get headaches during pregnancy from the pregnancy hormones  You can try tylenol (1-2 regular strength 325mg  or 1-2 extra strength 500mg ) as directed on the box. The least amount of medication that works is best.   Cool compresses (cool wet washcloth or ice pack) to area of head that is hurting  You can also try drinking a caffeinated drink to see if this will help  If not helping, try below:  For Prevention of Headaches/Migraines:  CoQ10 100mg  three times daily  Vitamin B2 400mg  daily  Magnesium Oxide 400-600mg  daily  If You Get a Bad Headache/Migraine:  Benadryl 25mg    Magnesium Oxide  1 large Gatorade  2 extra strength Tylenol (1,000mg  total)  1 cup coffee or Coke  If this doesn't help please call us @ [phone]   For Dizzy Spells:   This is usually related to either your blood sugar or your blood pressure dropping  Make sure you are staying well hydrated and drinking enough water so that your urine is clear  Eat small frequent meals and snacks containing protein (meat, eggs, nuts, cheese) so that your blood sugar doesn't drop  If you do get dizzy, sit/lay down and get you something to drink and a snack containing protein- you will usually start feeling better in 10-20 minutes    Second  Trimester of Pregnancy The second trimester is from week 13 through week 28 (months 4 through 6). The second trimester is often a time when you feel your best. Your body has also adjusted to being pregnant, and you begin to feel better physically. Usually, morning sickness has lessened or quit completely, you may have more energy, and you may have an increase in appetite. The second trimester is also a time when the fetus is growing rapidly. At the end of the sixth month, the fetus is about 9 inches long and weighs about 1 pounds. You will likely begin to feel the baby move (quickening) between 18 and 20 weeks of the pregnancy. Body changes during  your second trimester Your body continues to go through many changes during your second trimester. The changes vary from woman to woman.  Your weight will continue to increase. You will notice your lower abdomen bulging out.  You may begin to get stretch marks on your hips, abdomen, and breasts.  You may develop headaches that can be relieved by medicines. The medicines should be approved by your health care provider.  You may urinate more often because the fetus is pressing on your bladder.  You may develop or continue to have heartburn as a result of your pregnancy.  You may develop constipation because certain hormones are causing the muscles that push waste through your intestines to slow down.  You may develop hemorrhoids or swollen, bulging veins (varicose veins).  You may have back pain. This is caused by:  Weight gain.  Pregnancy hormones that are relaxing the joints in your pelvis.  A shift in weight and the muscles that support your balance.  Your breasts will continue to grow and they will continue to become tender.  Your gums may bleed and may be sensitive to brushing and flossing.  Dark spots or blotches (chloasma, mask of pregnancy) may develop on your face. This will likely fade after the baby is born.  A dark line from your belly button to the pubic area (linea nigra) may appear. This will likely fade after the baby is born.  You may have changes in your hair. These can include thickening of your hair, rapid growth, and changes in texture. Some women also have hair loss during or after pregnancy, or hair that feels dry or thin. Your hair will most likely return to normal after your baby is born. What to expect at prenatal visits During a routine prenatal visit:  You will be weighed to make sure you and the fetus are growing normally.  Your blood pressure will be taken.  Your abdomen will be measured to track your baby's growth.  The fetal heartbeat will be  listened to.  Any test results from the previous visit will be discussed. Your health care provider may ask you:  How you are feeling.  If you are feeling the baby move.  If you have had any abnormal symptoms, such as leaking fluid, bleeding, severe headaches, or abdominal cramping.  If you are using any tobacco products, including cigarettes, chewing tobacco, and electronic cigarettes.  If you have any questions. Other tests that may be performed during your second trimester include:  Blood tests that check for:  Low iron levels (anemia).  Gestational diabetes (between 24 and 28 weeks).  Rh antibodies. This is to check for a protein on red blood cells (Rh factor).  Urine tests to check for infections, diabetes, or protein in the urine.  An ultrasound to  confirm the proper growth and development of the baby.  An amniocentesis to check for possible genetic problems.  Fetal screens for spina bifida and Down syndrome.  HIV (human immunodeficiency virus) testing. Routine prenatal testing includes screening for HIV, unless you choose not to have this test. Follow these instructions at home: Eating and drinking  Continue to eat regular, healthy meals.  Avoid raw meat, uncooked cheese, cat litter boxes, and soil used by cats. These carry germs that can cause birth defects in the baby.  Take your prenatal vitamins.  Take 1500-2000 mg of calcium daily starting at the 20th week of pregnancy until you deliver your baby.  If you develop constipation:  Take over-the-counter or prescription medicines.  Drink enough fluid to keep your urine clear or pale yellow.  Eat foods that are high in fiber, such as fresh fruits and vegetables, whole grains, and beans.  Limit foods that are high in fat and processed sugars, such as fried and sweet foods. Activity  Exercise only as directed by your health care provider. Experiencing uterine cramps is a good sign to stop  exercising.  Avoid heavy lifting, wear low heel shoes, and practice good posture.  Wear your seat belt at all times when driving.  Rest with your legs elevated if you have leg cramps or low back pain.  Wear a good support bra for breast tenderness.  Do not use hot tubs, steam rooms, or saunas. Lifestyle  Avoid all smoking, herbs, alcohol, and unprescribed drugs. These chemicals affect the formation and growth of the baby.  Do not use any products that contain nicotine or tobacco, such as cigarettes and e-cigarettes. If you need help quitting, ask your health care provider.  A sexual relationship may be continued unless your health care provider directs you otherwise. General instructions  Follow your health care provider's instructions regarding medicine use. There are medicines that are either safe or unsafe to take during pregnancy.  Take warm sitz baths to soothe any pain or discomfort caused by hemorrhoids. Use hemorrhoid cream if your health care provider approves.  If you develop varicose veins, wear support hose. Elevate your feet for 15 minutes, 3-4 times a day. Limit salt in your diet.  Visit your dentist if you have not gone yet during your pregnancy. Use a soft toothbrush to brush your teeth and be gentle when you floss.  Keep all follow-up prenatal visits as told by your health care provider. This is important. Contact a health care provider if:  You have dizziness.  You have mild pelvic cramps, pelvic pressure, or nagging pain in the abdominal area.  You have persistent nausea, vomiting, or diarrhea.  You have a bad smelling vaginal discharge.  You have pain with urination. Get help right away if:  You have a fever.  You are leaking fluid from your vagina.  You have spotting or bleeding from your vagina.  You have severe abdominal cramping or pain.  You have rapid weight gain or weight loss.  You have shortness of breath with chest pain.  You notice  sudden or extreme swelling of your face, hands, ankles, feet, or legs.  You have not felt your baby move in over an hour.  You have severe headaches that do not go away with medicine.  You have vision changes. Summary  The second trimester is from week 13 through week 28 (months 4 through 6). It is also a time when the fetus is growing rapidly.  Your body goes  through many changes during pregnancy. The changes vary from woman to woman.  Avoid all smoking, herbs, alcohol, and unprescribed drugs. These chemicals affect the formation and growth your baby.  Do not use any tobacco products, such as cigarettes, chewing tobacco, and e-cigarettes. If you need help quitting, ask your health care provider.  Contact your health care provider if you have any questions. Keep all prenatal visits as told by your health care provider. This is important. This information is not intended to replace advice given to you by your health care provider. Make sure you discuss any questions you have with your health care provider. Document Released: 10/24/2001 Document Revised: 04/06/2016 Document Reviewed: 12/31/2012 Elsevier Interactive Patient Education  2017 [employer].

## 2017-01-01 NOTE — Progress Notes (Signed)
  Subjective:  Angelica Henry is a 26 y.o. 562P0010 Caucasian female at 2871w1d by LMP c/w 11wk u/s, being seen today for her first obstetrical visit.  Her obstetrical history is significant for SAB x 1, smoker: 1 cigarette/night prior to pregnancy, now 1/2 cigarette/night to help her sleep.  Pregnancy history fully reviewed.  Patient reports headaches, dizziness in shower, some nausea- but improving, declines meds. Denies vb, cramping, uti s/s, abnormal/malodorous vag d/c, or vulvovaginal itching/irritation.  BP 110/60   Pulse 62   Wt 213 lb (96.6 kg)   LMP 10/01/2016 (Exact Date)   BMI 36.56 kg/m   HISTORY: OB History  Gravida Para Term Preterm AB Living  2 0 0 0 1 0  SAB TAB Ectopic Multiple Live Births  1 0 0 0      # Outcome Date GA Lbr Len/2nd Weight Sex Delivery Anes PTL Lv  2 Current           1 SAB 2017             Past Medical History:  Diagnosis Date  . Asthma   . Dyspareunia 04/19/2015  . Herpes simplex virus (HSV) infection   . Vaginal Pap smear, abnormal    Past Surgical History:  Procedure Laterality Date  . cervical dilatation and uterine curettage    . DILATION AND EVACUATION N/A 08/02/2016   Procedure: CERVICAL DILATATION WITH SUCTION AND SHARP UTERINE CURETTAGE;  Surgeon: Lazaro ArmsLuther H Eure, MD;  Location: [facility];  Service: Gynecology;  Laterality: N/A;   Family History  Problem Relation Age of Onset  . Cancer Mother     cervical  . Heart disease Father   . Other Sister     cyst on ovary  . Glaucoma Maternal Grandmother   . Heart disease Maternal Grandmother   . Cirrhosis Maternal Grandfather   . Cancer Maternal Grandfather   . Other Paternal Grandfather     brain aneursym    Exam   System:     General: Well developed & nourished, no acute distress   Skin: Warm & dry, normal coloration and turgor, no rashes   Neurologic: Alert & oriented, normal mood   Cardiovascular: Regular rate & rhythm   Respiratory: Effort & rate normal, LCTAB, acyanotic   Abdomen: Soft, non tender   Extremities: normal strength, tone   Thin prep pap smear 04/19/15 neg FHR: + via nt u/s   Assessment:   Pregnancy: G2P0010 Patient Active Problem List   Diagnosis Date Noted  . Encounter for supervision of other normal pregnancy 01/01/2017  . Smoker 01/01/2017  . Depression 08/14/2016  . Dyspareunia 04/19/2015    3871w1d G2P0010 New OB visit Mild nausea Headaches Smoker Rh neg  Plan:  Initial labs obtained Continue prenatal vitamins Problem list reviewed and updated Reviewed n/v relief measures and warning s/s to report Reviewed recommended weight gain based on pre-gravid BMI Encouraged well-balanced diet Genetic Screening discussed Integrated Screen: 1st IT/NT today Cystic fibrosis screening discussed declined Ultrasound discussed; fetal survey: requested Follow up in 3 weeks for visit and 2nd IT CCNC completed Discussed and gave printed info for dizzy spells, headaches, n/v Advised to completely quit smoking  Marge DuncansBooker, Alexandru Moorer Randall CNM, [facility]NP[facility] 01/01/2017 3:10 PM

## 2017-01-02 ENCOUNTER — Encounter: Payer: Self-pay | Admitting: Women's Health

## 2017-01-02 DIAGNOSIS — F1290 Cannabis use, unspecified, uncomplicated: Secondary | ICD-10-CM | POA: Insufficient documentation

## 2017-01-02 LAB — SYPHILIS: RPR W/REFLEX TO RPR TITER AND TREPONEMAL ANTIBODIES, TRADITIONAL SCREENING AND DIAGNOSIS ALGORITHM: RPR Ser Ql: NONREACTIVE

## 2017-01-02 LAB — CBC
HEMOGLOBIN: 11.9 g/dL (ref 11.1–15.9)
Hematocrit: 35 % (ref 34.0–46.6)
MCH: 30.2 pg (ref 26.6–33.0)
MCHC: 34 g/dL (ref 31.5–35.7)
MCV: 89 fL (ref 79–97)
Platelets: 286 10*3/uL (ref 150–379)
RBC: 3.94 x10E6/uL (ref 3.77–5.28)
RDW: 14.4 % (ref 12.3–15.4)
WBC: 12.4 10*3/uL — ABNORMAL HIGH (ref 3.4–10.8)

## 2017-01-02 LAB — URINALYSIS, ROUTINE W REFLEX MICROSCOPIC
Bilirubin, UA: NEGATIVE
Glucose, UA: NEGATIVE
Ketones, UA: NEGATIVE
Leukocytes, UA: NEGATIVE
Nitrite, UA: NEGATIVE
Protein, UA: NEGATIVE
RBC, UA: NEGATIVE
Specific Gravity, UA: 1.02 (ref 1.005–1.030)
Urobilinogen, Ur: 0.2 mg/dL (ref 0.2–1.0)
pH, UA: 6.5 (ref 5.0–7.5)

## 2017-01-02 LAB — ABO/RH: RH TYPE: NEGATIVE

## 2017-01-02 LAB — PMP SCREEN PROFILE (10S), URINE
Amphetamine Screen, Ur: NEGATIVE ng/mL
BARBITURATE SCRN UR: NEGATIVE ng/mL
BENZODIAZEPINE SCREEN, URINE: NEGATIVE ng/mL
CANNABINOIDS UR QL SCN: POSITIVE ng/mL
COCAINE(METAB.) SCREEN, URINE: NEGATIVE ng/mL
Creatinine(Crt), U: 134.2 mg/dL (ref 20.0–300.0)
Methadone Scn, Ur: NEGATIVE ng/mL
OPIATE SCRN UR: NEGATIVE ng/mL
Oxycodone+Oxymorphone Ur Ql Scn: NEGATIVE ng/mL
PCP Scrn, Ur: NEGATIVE ng/mL
Ph of Urine: 6.1 (ref 4.5–8.9)
Propoxyphene, Screen: NEGATIVE ng/mL

## 2017-01-02 LAB — ANTIBODY SCREEN: Antibody Screen: NEGATIVE

## 2017-01-02 LAB — HIV ANTIBODY (ROUTINE TESTING W REFLEX): HIV SCREEN 4TH GENERATION: NONREACTIVE

## 2017-01-02 LAB — RUBELLA SCREEN: RUBELLA: 3.65 {index} (ref >0.99)

## 2017-01-02 LAB — VARICELLA ZOSTER ANTIBODY, IGG: VARICELLA: 2178 {index} (ref >165)

## 2017-01-02 LAB — HEPATITIS B SURFACE ANTIGEN: HEP B S AG: NEGATIVE

## 2017-01-03 LAB — URINE CULTURE: ORGANISM ID, BACTERIA: NO GROWTH

## 2017-01-03 LAB — GC/CHLAMYDIA PROBE AMP
Chlamydia trachomatis, NAA: NEGATIVE
Neisseria gonorrhoeae by PCR: NEGATIVE

## 2017-01-10 LAB — MATERNAL SCREEN, INTEGRATED #1
CROWN RUMP LENGTH MAT SCREEN: 73.9 mm
GEST. AGE ON COLLECTION DATE: 13.1 wk
Maternal Age at EDD: 28 years
Nuchal Translucency (NT): 1.7 mm
Number of Fetuses: 1
PAPP-A Value: 3517.4 ng/mL
WEIGHT: 213 [lb_av]

## 2017-01-22 ENCOUNTER — Encounter: Payer: Medicaid Other | Admitting: Women's Health

## 2017-01-29 ENCOUNTER — Encounter: Payer: Self-pay | Admitting: Women's Health

## 2017-01-29 ENCOUNTER — Ambulatory Visit (INDEPENDENT_AMBULATORY_CARE_PROVIDER_SITE_OTHER): Payer: Medicaid Other | Admitting: Women's Health

## 2017-01-29 VITALS — BP 100/50 | HR 76 | Wt 215.2 lb

## 2017-01-29 DIAGNOSIS — Z363 Encounter for antenatal screening for malformations: Secondary | ICD-10-CM

## 2017-01-29 DIAGNOSIS — Z1389 Encounter for screening for other disorder: Secondary | ICD-10-CM

## 2017-01-29 DIAGNOSIS — Z3482 Encounter for supervision of other normal pregnancy, second trimester: Secondary | ICD-10-CM

## 2017-01-29 DIAGNOSIS — Z331 Pregnant state, incidental: Secondary | ICD-10-CM

## 2017-01-29 DIAGNOSIS — Z3682 Encounter for antenatal screening for nuchal translucency: Secondary | ICD-10-CM

## 2017-01-29 LAB — POCT URINALYSIS DIPSTICK
Blood, UA: NEGATIVE
GLUCOSE UA: NEGATIVE
KETONES UA: NEGATIVE
LEUKOCYTES UA: NEGATIVE
Nitrite, UA: NEGATIVE

## 2017-01-29 NOTE — Progress Notes (Signed)
Low-risk OB appointment G2P0010 8168w1d Estimated Date of Delivery: 07/08/17 BP (!) 100/50   Pulse 76   Wt 215 lb 3.2 oz (97.6 kg)   LMP 10/01/2016 (Exact Date)   BMI 36.94 kg/m   BP, weight, and urine reviewed.  Refer to obstetrical flow sheet for FH & FHR.  No fm yet. Denies cramping, lof, vb, or uti s/s. BP drops at time and feels dizzy. Discussed physiological decrease in bp during 2nd trimester. Gave printed info/tips on dizzy spells.  Reviewed warning s/s to report. Plan:  Continue routine obstetrical care  F/U in 3wks for OB appointment and anatomy u/s 2nd IT today

## 2017-01-29 NOTE — Patient Instructions (Signed)
For Dizzy Spells:   This is usually related to either your blood sugar or your blood pressure dropping  Make sure you are staying well hydrated and drinking enough water so that your urine is clear  Eat small frequent meals and snacks containing protein (meat, eggs, nuts, cheese) so that your blood sugar doesn't drop  If you do get dizzy, sit/lay down and get you something to drink and a snack containing protein- you will usually start feeling better in 10-20 minutes    For Headaches:   Stay well hydrated, drink enough water so that your urine is clear, sometimes if you are dehydrated you can get headaches  Eat small frequent meals and snacks, sometimes if you are hungry you can get headaches  Sometimes you get headaches during pregnancy from the pregnancy hormones  You can try tylenol (1-2 regular strength 325mg or 1-2 extra strength 500mg) as directed on the box. The least amount of medication that works is best.   Cool compresses (cool wet washcloth or ice pack) to area of head that is hurting  You can also try drinking a caffeinated drink to see if this will help  If not helping, try below:  For Prevention of Headaches/Migraines:  CoQ10 100mg three times daily  Vitamin B2 400mg daily  Magnesium Oxide 400-600mg daily  If You Get a Bad Headache/Migraine:  Benadryl 25mg   Magnesium Oxide  1 large Gatorade  2 extra strength Tylenol (1,000mg total)  1 cup coffee or Coke  If this doesn't help please call us @ [phone]    Second Trimester of Pregnancy The second trimester is from week 14 through week 27 (months 4 through 6). The second trimester is often a time when you feel your best. Your body has adjusted to being pregnant, and you begin to feel better physically. Usually, morning sickness has lessened or quit completely, you may have more energy, and you may have an increase in appetite. The second trimester is also a time when the fetus is growing rapidly. At  the end of the sixth month, the fetus is about 9 inches long and weighs about 1 pounds. You will likely begin to feel the baby move (quickening) between 16 and 20 weeks of pregnancy. Body changes during your second trimester Your body continues to go through many changes during your second trimester. The changes vary from woman to woman.  Your weight will continue to increase. You will notice your lower abdomen bulging out.  You may begin to get stretch marks on your hips, abdomen, and breasts.  You may develop headaches that can be relieved by medicines. The medicines should be approved by your health care provider.  You may urinate more often because the fetus is pressing on your bladder.  You may develop or continue to have heartburn as a result of your pregnancy.  You may develop constipation because certain hormones are causing the muscles that push waste through your intestines to slow down.  You may develop hemorrhoids or swollen, bulging veins (varicose veins).  You may have back pain. This is caused by: ? Weight gain. ? Pregnancy hormones that are relaxing the joints in your pelvis. ? A shift in weight and the muscles that support your balance.  Your breasts will continue to grow and they will continue to become tender.  Your gums may bleed and may be sensitive to brushing and flossing.  Dark spots or blotches (chloasma, mask of pregnancy) may develop on your face. This   will likely fade after the baby is born.  A dark line from your belly button to the pubic area (linea nigra) may appear. This will likely fade after the baby is born.  You may have changes in your hair. These can include thickening of your hair, rapid growth, and changes in texture. Some women also have hair loss during or after pregnancy, or hair that feels dry or thin. Your hair will most likely return to normal after your baby is born.  What to expect at prenatal visits During a routine prenatal  visit:  You will be weighed to make sure you and the fetus are growing normally.  Your blood pressure will be taken.  Your abdomen will be measured to track your baby's growth.  The fetal heartbeat will be listened to.  Any test results from the previous visit will be discussed.  Your health care provider may ask you:  How you are feeling.  If you are feeling the baby move.  If you have had any abnormal symptoms, such as leaking fluid, bleeding, severe headaches, or abdominal cramping.  If you are using any tobacco products, including cigarettes, chewing tobacco, and electronic cigarettes.  If you have any questions.  Other tests that may be performed during your second trimester include:  Blood tests that check for: ? Low iron levels (anemia). ? High blood sugar that affects pregnant women (gestational diabetes) between 24 and 28 weeks. ? Rh antibodies. This is to check for a protein on red blood cells (Rh factor).  Urine tests to check for infections, diabetes, or protein in the urine.  An ultrasound to confirm the proper growth and development of the baby.  An amniocentesis to check for possible genetic problems.  Fetal screens for spina bifida and Down syndrome.  HIV (human immunodeficiency virus) testing. Routine prenatal testing includes screening for HIV, unless you choose not to have this test.  Follow these instructions at home: Medicines  Follow your health care provider's instructions regarding medicine use. Specific medicines may be either safe or unsafe to take during pregnancy.  Take a prenatal vitamin that contains at least 600 micrograms (mcg) of folic acid.  If you develop constipation, try taking a stool softener if your health care provider approves. Eating and drinking  Eat a balanced diet that includes fresh fruits and vegetables, whole grains, good sources of protein such as meat, eggs, or tofu, and low-fat dairy. Your health care provider will  help you determine the amount of weight gain that is right for you.  Avoid raw meat and uncooked cheese. These carry germs that can cause birth defects in the baby.  If you have low calcium intake from food, talk to your health care provider about whether you should take a daily calcium supplement.  Limit foods that are high in fat and processed sugars, such as fried and sweet foods.  To prevent constipation: ? Drink enough fluid to keep your urine clear or pale yellow. ? Eat foods that are high in fiber, such as fresh fruits and vegetables, whole grains, and beans. Activity  Exercise only as directed by your health care provider. Most women can continue their usual exercise routine during pregnancy. Try to exercise for 30 minutes at least 5 days a week. Stop exercising if you experience uterine contractions.  Avoid heavy lifting, wear low heel shoes, and practice good posture.  A sexual relationship may be continued unless your health care provider directs you otherwise. Relieving pain and discomfort    Wear a good support bra to prevent discomfort from breast tenderness.  Take warm sitz baths to soothe any pain or discomfort caused by hemorrhoids. Use hemorrhoid cream if your health care provider approves.  Rest with your legs elevated if you have leg cramps or low back pain.  If you develop varicose veins, wear support hose. Elevate your feet for 15 minutes, 3-4 times a day. Limit salt in your diet. Prenatal Care  Write down your questions. Take them to your prenatal visits.  Keep all your prenatal visits as told by your health care provider. This is important. Safety  Wear your seat belt at all times when driving.  Make a list of emergency phone numbers, including numbers for family, friends, the hospital, and police and fire departments. General instructions  Ask your health care provider for a referral to a local prenatal education class. Begin classes no later than the  beginning of month 6 of your pregnancy.  Ask for help if you have counseling or nutritional needs during pregnancy. Your health care provider can offer advice or refer you to specialists for help with various needs.  Do not use hot tubs, steam rooms, or saunas.  Do not douche or use tampons or scented sanitary pads.  Do not cross your legs for long periods of time.  Avoid cat litter boxes and soil used by cats. These carry germs that can cause birth defects in the baby and possibly loss of the fetus by miscarriage or stillbirth.  Avoid all smoking, herbs, alcohol, and unprescribed drugs. Chemicals in these products can affect the formation and growth of the baby.  Do not use any products that contain nicotine or tobacco, such as cigarettes and e-cigarettes. If you need help quitting, ask your health care provider.  Visit your dentist if you have not gone yet during your pregnancy. Use a soft toothbrush to brush your teeth and be gentle when you floss. Contact a health care provider if:  You have dizziness.  You have mild pelvic cramps, pelvic pressure, or nagging pain in the abdominal area.  You have persistent nausea, vomiting, or diarrhea.  You have a bad smelling vaginal discharge.  You have pain when you urinate. Get help right away if:  You have a fever.  You are leaking fluid from your vagina.  You have spotting or bleeding from your vagina.  You have severe abdominal cramping or pain.  You have rapid weight gain or weight loss.  You have shortness of breath with chest pain.  You notice sudden or extreme swelling of your face, hands, ankles, feet, or legs.  You have not felt your baby move in over an hour.  You have severe headaches that do not go away when you take medicine.  You have vision changes. Summary  The second trimester is from week 14 through week 27 (months 4 through 6). It is also a time when the fetus is growing rapidly.  Your body goes  through many changes during pregnancy. The changes vary from woman to woman.  Avoid all smoking, herbs, alcohol, and unprescribed drugs. These chemicals affect the formation and growth your baby.  Do not use any tobacco products, such as cigarettes, chewing tobacco, and e-cigarettes. If you need help quitting, ask your health care provider.  Contact your health care provider if you have any questions. Keep all prenatal visits as told by your health care provider. This is important. This information is not intended to replace advice given to   you by your health care provider. Make sure you discuss any questions you have with your health care provider. Document Released: 10/24/2001 Document Revised: 04/06/2016 Document Reviewed: 12/31/2012 Elsevier Interactive Patient Education  2017 [employer]  

## 2017-02-02 LAB — MATERNAL SCREEN, INTEGRATED #2
AFP MARKER: 45.4 ng/mL
AFP MoM: 1.65
CROWN RUMP LENGTH: 73.9 mm
DIA MOM: 0.57
DIA Value: 80.1 pg/mL
ESTRIOL UNCONJUGATED: 1.23 ng/mL
GEST. AGE ON COLLECTION DATE: 13.1 wk
GESTATIONAL AGE: 17.1 wk
Maternal Age at EDD: 28 years
NUCHAL TRANSLUCENCY MOM: 0.94
Nuchal Translucency (NT): 1.7 mm
Number of Fetuses: 1
PAPP-A MOM: 4.49
PAPP-A Value: 3517.4 ng/mL
TEST RESULTS: NEGATIVE
WEIGHT: 213 [lb_av]
Weight: 213 [lb_av]
hCG MoM: 0.56
hCG Value: 12 IU/mL
uE3 MoM: 1.31

## 2017-02-19 ENCOUNTER — Ambulatory Visit (INDEPENDENT_AMBULATORY_CARE_PROVIDER_SITE_OTHER): Payer: Medicaid Other | Admitting: Women's Health

## 2017-02-19 ENCOUNTER — Ambulatory Visit (INDEPENDENT_AMBULATORY_CARE_PROVIDER_SITE_OTHER): Payer: Medicaid Other

## 2017-02-19 ENCOUNTER — Encounter: Payer: Self-pay | Admitting: Women's Health

## 2017-02-19 VITALS — BP 112/64 | HR 62 | Wt 219.0 lb

## 2017-02-19 DIAGNOSIS — Z3482 Encounter for supervision of other normal pregnancy, second trimester: Secondary | ICD-10-CM

## 2017-02-19 DIAGNOSIS — Z1389 Encounter for screening for other disorder: Secondary | ICD-10-CM

## 2017-02-19 DIAGNOSIS — Z331 Pregnant state, incidental: Secondary | ICD-10-CM

## 2017-02-19 DIAGNOSIS — Z363 Encounter for antenatal screening for malformations: Secondary | ICD-10-CM | POA: Diagnosis not present

## 2017-02-19 DIAGNOSIS — O283 Abnormal ultrasonic finding on antenatal screening of mother: Secondary | ICD-10-CM

## 2017-02-19 LAB — POCT URINALYSIS DIPSTICK
Glucose, UA: NEGATIVE
Ketones, UA: NEGATIVE
Leukocytes, UA: NEGATIVE
NITRITE UA: NEGATIVE
Protein, UA: NEGATIVE
RBC UA: NEGATIVE

## 2017-02-19 NOTE — Patient Instructions (Signed)

## 2017-02-19 NOTE — Progress Notes (Signed)
[place] 20+1 wks,ant pl gr 0,cephalic,cx 4.2 cm,svp of fluid 4 cm,normal ovaries bilat,fhr 150 bpm,LVEICF 2.8 mm,EFW 377 g,limited view of profile and NB,please have pt come back for additional images

## 2017-02-19 NOTE — Progress Notes (Signed)
Low-risk OB appointment G2P0010 [redacted]w[redacted]d Estimated Date of Delivery: 07/08/17 BP 112/64   Pulse 62   Wt 219 lb (99.3 kg)   LMP 10/01/2016 (Exact Date)   BMI 37.59 kg/m   BP, weight, and urine reviewed.  Refer to obstetrical flow sheet for FH & FHR.  Reports good fm.  Denies regular uc's, lof, vb, or uti s/s. No complaints. Reviewed today's anatomy u/s: limited views face, Lt EICF, neg nt/it. Discussed and gave printed info on EICF.  Plan:  Continue routine obstetrical care  F/U in 4wks for OB appointment and u/s d/t limited views face, Lt EICF

## 2017-03-19 ENCOUNTER — Other Ambulatory Visit: Payer: Medicaid Other

## 2017-03-19 ENCOUNTER — Encounter: Payer: Medicaid Other | Admitting: Obstetrics & Gynecology

## 2017-03-23 ENCOUNTER — Other Ambulatory Visit: Payer: Self-pay | Admitting: Women's Health

## 2017-03-23 DIAGNOSIS — Z0489 Encounter for examination and observation for other specified reasons: Secondary | ICD-10-CM

## 2017-03-23 DIAGNOSIS — O283 Abnormal ultrasonic finding on antenatal screening of mother: Secondary | ICD-10-CM

## 2017-03-23 DIAGNOSIS — IMO0002 Reserved for concepts with insufficient information to code with codable children: Secondary | ICD-10-CM

## 2017-03-26 ENCOUNTER — Ambulatory Visit (INDEPENDENT_AMBULATORY_CARE_PROVIDER_SITE_OTHER): Payer: Medicaid Other | Admitting: Obstetrics & Gynecology

## 2017-03-26 ENCOUNTER — Encounter: Payer: Self-pay | Admitting: Women's Health

## 2017-03-26 ENCOUNTER — Encounter: Payer: Self-pay | Admitting: Obstetrics & Gynecology

## 2017-03-26 ENCOUNTER — Ambulatory Visit (INDEPENDENT_AMBULATORY_CARE_PROVIDER_SITE_OTHER): Payer: Medicaid Other

## 2017-03-26 VITALS — BP 114/72 | HR 71 | Wt 215.0 lb

## 2017-03-26 DIAGNOSIS — IMO0002 Reserved for concepts with insufficient information to code with codable children: Secondary | ICD-10-CM

## 2017-03-26 DIAGNOSIS — O283 Abnormal ultrasonic finding on antenatal screening of mother: Secondary | ICD-10-CM

## 2017-03-26 DIAGNOSIS — Z331 Pregnant state, incidental: Secondary | ICD-10-CM

## 2017-03-26 DIAGNOSIS — Z3482 Encounter for supervision of other normal pregnancy, second trimester: Secondary | ICD-10-CM

## 2017-03-26 DIAGNOSIS — Z048 Encounter for examination and observation for other specified reasons: Secondary | ICD-10-CM | POA: Diagnosis not present

## 2017-03-26 DIAGNOSIS — Z1389 Encounter for screening for other disorder: Secondary | ICD-10-CM

## 2017-03-26 DIAGNOSIS — Z0489 Encounter for examination and observation for other specified reasons: Secondary | ICD-10-CM

## 2017-03-26 LAB — POCT URINALYSIS DIPSTICK
Blood, UA: NEGATIVE
GLUCOSE UA: NEGATIVE
KETONES UA: NEGATIVE
NITRITE UA: NEGATIVE

## 2017-03-26 NOTE — Progress Notes (Signed)
G2P0010 541w1d Estimated Date of Delivery: 07/08/17  Blood pressure 114/72, pulse 71, weight 215 lb (97.5 kg), last menstrual period 10/01/2016.   BP weight and urine results all reviewed and noted.  Please refer to the obstetrical flow sheet for the fundal height and fetal heart rate documentation:  Patient reports good fetal movement, denies any bleeding and no rupture of membranes symptoms or regular contractions. Patient is without complaints. All questions were answered.  Orders Placed This Encounter  Procedures  . POCT urinalysis dipstick    Plan:  Continued routine obstetrical care, sonogram is normal   No Follow-up on file.

## 2017-03-26 NOTE — Progress Notes (Signed)
US 25+1 wks,cephalic,cx 3.7 cm,ant pl gr 0,normal ovaries bilat,svp of fluid 4.5 cm,fhr 145 bpm,LVEICF N/C,efw 824 g 55%,anatomy complete

## 2017-04-06 ENCOUNTER — Telehealth: Payer: Self-pay | Admitting: *Deleted

## 2017-04-06 NOTE — Telephone Encounter (Signed)
Spoke with pt. Pt is having vaginal itching and pink spotting. + vaginal swelling. Has been going on x 3 weeks, off and on. Pt has had sex recently. Spotting has stopped now. I advised to drink plenty of fluids and take it easy. Advised to call the after hours nurse line if need be after 2. Pt voiced understanding. JSY

## 2017-04-10 ENCOUNTER — Telehealth: Payer: Self-pay | Admitting: *Deleted

## 2017-04-10 NOTE — Telephone Encounter (Signed)
LMOVM to return call for follow-up with call to after hours nurse regarding bleeding.

## 2017-04-16 ENCOUNTER — Other Ambulatory Visit: Payer: Medicaid Other

## 2017-04-16 ENCOUNTER — Encounter: Payer: Self-pay | Admitting: Obstetrics & Gynecology

## 2017-04-16 ENCOUNTER — Ambulatory Visit (INDEPENDENT_AMBULATORY_CARE_PROVIDER_SITE_OTHER): Payer: Medicaid Other | Admitting: Obstetrics & Gynecology

## 2017-04-16 VITALS — BP 110/70 | HR 72 | Wt 222.0 lb

## 2017-04-16 DIAGNOSIS — Z3482 Encounter for supervision of other normal pregnancy, second trimester: Secondary | ICD-10-CM

## 2017-04-16 DIAGNOSIS — Z331 Pregnant state, incidental: Secondary | ICD-10-CM

## 2017-04-16 DIAGNOSIS — Z3A28 28 weeks gestation of pregnancy: Secondary | ICD-10-CM

## 2017-04-16 DIAGNOSIS — Z1389 Encounter for screening for other disorder: Secondary | ICD-10-CM

## 2017-04-16 DIAGNOSIS — Z131 Encounter for screening for diabetes mellitus: Secondary | ICD-10-CM

## 2017-04-16 LAB — POCT URINALYSIS DIPSTICK
Blood, UA: NEGATIVE
GLUCOSE UA: NEGATIVE
KETONES UA: NEGATIVE
Leukocytes, UA: NEGATIVE
Nitrite, UA: NEGATIVE
Protein, UA: NEGATIVE

## 2017-04-16 NOTE — Progress Notes (Signed)
G2P0010 256w1d Estimated Date of Delivery: 07/08/17  Blood pressure 110/70, pulse 72, weight 222 lb (100.7 kg), last menstrual period 10/01/2016.   BP weight and urine results all reviewed and noted.  Please refer to the obstetrical flow sheet for the fundal height and fetal heart rate documentation:  Patient reports good fetal movement, denies any bleeding and no rupture of membranes symptoms or regular contractions. Patient is without complaints. All questions were answered.  Orders Placed This Encounter  Procedures  . POCT urinalysis dipstick    Plan:  Continued routine obstetrical care,   Return in about 3 weeks (around 05/07/2017) for LROB.

## 2017-04-17 LAB — CBC
Hematocrit: 31.3 % — ABNORMAL LOW (ref 34.0–46.6)
Hemoglobin: 10.3 g/dL — ABNORMAL LOW (ref 11.1–15.9)
MCH: 30.2 pg (ref 26.6–33.0)
MCHC: 32.9 g/dL (ref 31.5–35.7)
MCV: 92 fL (ref 79–97)
PLATELETS: 206 10*3/uL (ref 150–379)
RBC: 3.41 x10E6/uL — AB (ref 3.77–5.28)
RDW: 13.4 % (ref 12.3–15.4)
WBC: 12.4 10*3/uL — ABNORMAL HIGH (ref 3.4–10.8)

## 2017-04-17 LAB — GLUCOSE TOLERANCE, 2 HOURS W/ 1HR
GLUCOSE, 1 HOUR: 146 mg/dL (ref 65–179)
GLUCOSE, 2 HOUR: 74 mg/dL (ref 65–152)
GLUCOSE, FASTING: 81 mg/dL (ref 65–91)

## 2017-04-17 LAB — HIV ANTIBODY (ROUTINE TESTING W REFLEX): HIV SCREEN 4TH GENERATION: NONREACTIVE

## 2017-04-17 LAB — RPR: RPR: NONREACTIVE

## 2017-04-17 LAB — ANTIBODY SCREEN: Antibody Screen: NEGATIVE

## 2017-04-23 ENCOUNTER — Ambulatory Visit (INDEPENDENT_AMBULATORY_CARE_PROVIDER_SITE_OTHER): Payer: Medicaid Other | Admitting: *Deleted

## 2017-04-23 ENCOUNTER — Encounter: Payer: Self-pay | Admitting: *Deleted

## 2017-04-23 VITALS — BP 104/58 | HR 72 | Ht 64.0 in | Wt 220.5 lb

## 2017-04-23 DIAGNOSIS — Z3482 Encounter for supervision of other normal pregnancy, second trimester: Secondary | ICD-10-CM

## 2017-04-23 DIAGNOSIS — O09899 Supervision of other high risk pregnancies, unspecified trimester: Secondary | ICD-10-CM

## 2017-04-23 DIAGNOSIS — Z331 Pregnant state, incidental: Secondary | ICD-10-CM | POA: Diagnosis not present

## 2017-04-23 DIAGNOSIS — O26899 Other specified pregnancy related conditions, unspecified trimester: Principal | ICD-10-CM

## 2017-04-23 DIAGNOSIS — Z1389 Encounter for screening for other disorder: Secondary | ICD-10-CM | POA: Diagnosis not present

## 2017-04-23 DIAGNOSIS — Z6791 Unspecified blood type, Rh negative: Secondary | ICD-10-CM | POA: Diagnosis not present

## 2017-04-23 LAB — POCT URINALYSIS DIPSTICK
Blood, UA: NEGATIVE
GLUCOSE UA: NEGATIVE
Ketones, UA: NEGATIVE
LEUKOCYTES UA: NEGATIVE
NITRITE UA: NEGATIVE
Protein, UA: NEGATIVE

## 2017-04-23 MED ORDER — RHO D IMMUNE GLOBULIN 1500 UNIT/2ML IJ SOSY
300.0000 ug | PREFILLED_SYRINGE | Freq: Once | INTRAMUSCULAR | Status: AC
  Administered 2017-04-23: 300 ug via INTRAMUSCULAR

## 2017-04-23 NOTE — Progress Notes (Signed)
Pt here for Rhogam. Pt tolerated shot well. Return at scheduled appt. JSY

## 2017-05-07 ENCOUNTER — Encounter: Payer: Medicaid Other | Admitting: Obstetrics and Gynecology

## 2017-05-09 ENCOUNTER — Ambulatory Visit (INDEPENDENT_AMBULATORY_CARE_PROVIDER_SITE_OTHER): Payer: Medicaid Other | Admitting: Obstetrics and Gynecology

## 2017-05-09 ENCOUNTER — Encounter: Payer: Self-pay | Admitting: Obstetrics and Gynecology

## 2017-05-09 VITALS — BP 94/54 | HR 60 | Wt 218.4 lb

## 2017-05-09 DIAGNOSIS — Z1389 Encounter for screening for other disorder: Secondary | ICD-10-CM

## 2017-05-09 DIAGNOSIS — Z3483 Encounter for supervision of other normal pregnancy, third trimester: Secondary | ICD-10-CM

## 2017-05-09 DIAGNOSIS — Z331 Pregnant state, incidental: Secondary | ICD-10-CM

## 2017-05-09 LAB — POCT URINALYSIS DIPSTICK
Blood, UA: NEGATIVE
Glucose, UA: NEGATIVE
KETONES UA: NEGATIVE
Leukocytes, UA: NEGATIVE
Nitrite, UA: NEGATIVE
PROTEIN UA: NEGATIVE

## 2017-05-09 NOTE — Progress Notes (Signed)
G2P0010  [facility]  Chief Complaint  Patient presents with  . Routine Prenatal Visit  ____  Patient complaints:none. wWeight has remained stable the whole pregnancy patient reassured. Patient reports   good fetal movement,                           denies any bleeding , rupture of membranes,or regular contractions.  Blood pressure (!) 94/54, pulse 60, weight 218 lb 6.4 oz (99.1 kg), last menstrual period 10/01/2016.   Urine results:notable for negative for protein and glucose refer to the ob flow sheet for FH and FHR, ,                          Physical Examination: General appearance - alert, well appearing, and in no distress                                      Abdomen - FH 33 ,                                                         -FHR 131                                                         soft, nontender, nondistended, no masses or organomegaly                                      Pelvic - not required                                            Questions were answered. Assessment: LROB G2P0010 @ 4584w3d routine visit  Plan:  Continued routine obstetrical care, breast-feeding/ Nexplanon Nexplanon  F/u in 2 weeks for LROB

## 2017-05-21 ENCOUNTER — Encounter: Payer: Self-pay | Admitting: Obstetrics & Gynecology

## 2017-05-21 ENCOUNTER — Ambulatory Visit (INDEPENDENT_AMBULATORY_CARE_PROVIDER_SITE_OTHER): Payer: Medicaid Other | Admitting: Obstetrics & Gynecology

## 2017-05-21 VITALS — BP 96/60 | HR 78 | Wt 226.3 lb

## 2017-05-21 DIAGNOSIS — Z1389 Encounter for screening for other disorder: Secondary | ICD-10-CM

## 2017-05-21 DIAGNOSIS — Z331 Pregnant state, incidental: Secondary | ICD-10-CM

## 2017-05-21 DIAGNOSIS — Z3A33 33 weeks gestation of pregnancy: Secondary | ICD-10-CM

## 2017-05-21 DIAGNOSIS — Z3483 Encounter for supervision of other normal pregnancy, third trimester: Secondary | ICD-10-CM

## 2017-05-21 LAB — POCT URINALYSIS DIPSTICK
GLUCOSE UA: NEGATIVE
KETONES UA: NEGATIVE
Leukocytes, UA: NEGATIVE
Nitrite, UA: NEGATIVE
Protein, UA: NEGATIVE
RBC UA: NEGATIVE

## 2017-05-21 NOTE — Progress Notes (Signed)
G2P0010 1566w1d Estimated Date of Delivery: 07/08/17  Blood pressure 96/60, pulse 78, weight 226 lb 4.8 oz (102.6 kg), last menstrual period 10/01/2016.   BP weight and urine results all reviewed and noted.  Please refer to the obstetrical flow sheet for the fundal height and fetal heart rate documentation:  Patient reports good fetal movement, denies any bleeding and no rupture of membranes symptoms or regular contractions. Patient is without complaints. All questions were answered.  Orders Placed This Encounter  Procedures  . POCT urinalysis dipstick    Plan:  Continued routine obstetrical care,   No Follow-up on file.

## 2017-06-05 ENCOUNTER — Ambulatory Visit (INDEPENDENT_AMBULATORY_CARE_PROVIDER_SITE_OTHER): Payer: Medicaid Other | Admitting: Women's Health

## 2017-06-05 ENCOUNTER — Encounter: Payer: Self-pay | Admitting: Women's Health

## 2017-06-05 VITALS — BP 124/78 | HR 76 | Wt 228.2 lb

## 2017-06-05 DIAGNOSIS — Z3483 Encounter for supervision of other normal pregnancy, third trimester: Secondary | ICD-10-CM

## 2017-06-05 DIAGNOSIS — Z1389 Encounter for screening for other disorder: Secondary | ICD-10-CM

## 2017-06-05 DIAGNOSIS — Z331 Pregnant state, incidental: Secondary | ICD-10-CM

## 2017-06-05 DIAGNOSIS — Z3A35 35 weeks gestation of pregnancy: Secondary | ICD-10-CM

## 2017-06-05 LAB — POCT URINALYSIS DIPSTICK
Blood, UA: NEGATIVE
Glucose, UA: NEGATIVE
Ketones, UA: NEGATIVE
NITRITE UA: NEGATIVE

## 2017-06-05 NOTE — Progress Notes (Signed)
Low-risk OB appointment G2P0010 3154w2d Estimated Date of Delivery: 07/08/17 BP 124/78   Pulse 76   Wt 228 lb 3.2 oz (103.5 kg)   LMP 10/01/2016 (Exact Date)   BMI 39.17 kg/m   BP, weight, and urine reviewed.  Refer to obstetrical flow sheet for FH & FHR.  Reports good fm.  Denies regular uc's, lof, vb, or uti s/s. No complaints. Reviewed ptl s/s, fkc. Plan:  Continue routine obstetrical care  F/U in 1wk for OB appointment

## 2017-06-05 NOTE — Patient Instructions (Signed)
Call the office ([phone]) or go to [facility] if:  You begin to have strong, frequent contractions  Your water breaks.  Sometimes it is a big gush of fluid, sometimes it is just a trickle that keeps getting your panties wet or running down your legs  You have vaginal bleeding.  It is normal to have a small amount of spotting if your cervix was checked.   You don't feel your baby moving like normal.  If you don't, get you something to eat and drink and lay down and focus on feeling your baby move.  You should feel at least 10 movements in 2 hours.  If you don't, you should call the office or go to [facility].     Preterm Labor and Birth Information The normal length of a pregnancy is 39-41 weeks. Preterm labor is when labor starts before 37 completed weeks of pregnancy. What are the risk factors for preterm labor? Preterm labor is more likely to occur in women who:  Have certain infections during pregnancy such as a bladder infection, sexually transmitted infection, or infection inside the uterus (chorioamnionitis).  Have a shorter-than-normal cervix.  Have gone into preterm labor before.  Have had surgery on their cervix.  Are younger than age 17 or older than age 35.  Are African American.  Are pregnant with twins or multiple babies (multiple gestation).  Take street drugs or smoke while pregnant.  Do not gain enough weight while pregnant.  Became pregnant shortly after having been pregnant.  What are the symptoms of preterm labor? Symptoms of preterm labor include:  Cramps similar to those that can happen during a menstrual period. The cramps may happen with diarrhea.  Pain in the abdomen or lower back.  Regular uterine contractions that may feel like tightening of the abdomen.  A feeling of increased pressure in the pelvis.  Increased watery or bloody mucus discharge from the vagina.  Water breaking (ruptured amniotic sac).  Why is it important to  recognize signs of preterm labor? It is important to recognize signs of preterm labor because babies who are born prematurely may not be fully developed. This can put them at an increased risk for:  Long-term (chronic) heart and lung problems.  Difficulty immediately after birth with regulating body systems, including blood sugar, body temperature, heart rate, and breathing rate.  Bleeding in the brain.  Cerebral palsy.  Learning difficulties.  Death.  These risks are highest for babies who are born before 34 weeks of pregnancy. How is preterm labor treated? Treatment depends on the length of your pregnancy, your condition, and the health of your baby. It may involve:  Having a stitch (suture) placed in your cervix to prevent your cervix from opening too early (cerclage).  Taking or being given medicines, such as: ? Hormone medicines. These may be given early in pregnancy to help support the pregnancy. ? Medicine to stop contractions. ? Medicines to help mature the baby's lungs. These may be prescribed if the risk of delivery is high. ? Medicines to prevent your baby from developing cerebral palsy.  If the labor happens before 34 weeks of pregnancy, you may need to stay in the hospital. What should I do if I think I am in preterm labor? If you think that you are going into preterm labor, call your health care provider right away. How can I prevent preterm labor in future pregnancies? To increase your chance of having a full-term pregnancy:  Do not use   any tobacco products, such as cigarettes, chewing tobacco, and e-cigarettes. If you need help quitting, ask your health care provider.  Do not use street drugs or medicines that have not been prescribed to you during your pregnancy.  Talk with your health care provider before taking any herbal supplements, even if you have been taking them regularly.  Make sure you gain a healthy amount of weight during your pregnancy.  Watch  for infection. If you think that you might have an infection, get it checked right away.  Make sure to tell your health care provider if you have gone into preterm labor before.  This information is not intended to replace advice given to you by your health care provider. Make sure you discuss any questions you have with your health care provider. Document Released: 01/20/2004 Document Revised: 04/11/2016 Document Reviewed: 03/22/2016 Elsevier Interactive Patient Education  2018 [employer] Inc.  

## 2017-06-12 ENCOUNTER — Encounter: Payer: Self-pay | Admitting: Advanced Practice Midwife

## 2017-06-12 ENCOUNTER — Ambulatory Visit (INDEPENDENT_AMBULATORY_CARE_PROVIDER_SITE_OTHER): Payer: Medicaid Other | Admitting: Advanced Practice Midwife

## 2017-06-12 VITALS — BP 138/72 | HR 64 | Wt 228.0 lb

## 2017-06-12 DIAGNOSIS — O98313 Other infections with a predominantly sexual mode of transmission complicating pregnancy, third trimester: Secondary | ICD-10-CM

## 2017-06-12 DIAGNOSIS — O09893 Supervision of other high risk pregnancies, third trimester: Secondary | ICD-10-CM

## 2017-06-12 DIAGNOSIS — Z331 Pregnant state, incidental: Secondary | ICD-10-CM

## 2017-06-12 DIAGNOSIS — A599 Trichomoniasis, unspecified: Secondary | ICD-10-CM

## 2017-06-12 DIAGNOSIS — Z3A36 36 weeks gestation of pregnancy: Secondary | ICD-10-CM

## 2017-06-12 DIAGNOSIS — Z1389 Encounter for screening for other disorder: Secondary | ICD-10-CM

## 2017-06-12 DIAGNOSIS — Z3483 Encounter for supervision of other normal pregnancy, third trimester: Secondary | ICD-10-CM

## 2017-06-12 LAB — POCT URINALYSIS DIPSTICK
Glucose, UA: NEGATIVE
Ketones, UA: NEGATIVE
Nitrite, UA: NEGATIVE

## 2017-06-12 MED ORDER — METRONIDAZOLE 500 MG PO TABS: 500.0000 mg | ORAL_TABLET | Freq: Two times a day (BID) | ORAL | 0 refills | Status: DC

## 2017-06-12 NOTE — Patient Instructions (Signed)

## 2017-06-12 NOTE — Progress Notes (Signed)
G2P0010 4349w2d Estimated Date of Delivery: 07/08/17  Blood pressure 138/72, pulse 64, weight 228 lb (103.4 kg), last menstrual period 10/01/2016.   BP weight and urine results all reviewed and noted.  Please refer to the obstetrical flow sheet for the fundal height and fetal heart rate documentation:  Patient reports good fetal movement, denies any bleeding and no rupture of membranes symptoms or regular contractions. Patient is without complaints.except itchy vagina is back. Had trich 2 months ago, was treated, says hasn't had sex since treatment.   All questions were answered.  Orders Placed This Encounter  Procedures  . POCT Urinalysis Dipstick    Plan:  Continued routine obstetrical care, flagyl 500mg  BID X 7  Return in about 2 days (around 06/14/2017) for LROB/bp check.

## 2017-06-14 ENCOUNTER — Ambulatory Visit (INDEPENDENT_AMBULATORY_CARE_PROVIDER_SITE_OTHER): Payer: Medicaid Other | Admitting: Women's Health

## 2017-06-14 ENCOUNTER — Encounter: Payer: Self-pay | Admitting: Women's Health

## 2017-06-14 VITALS — BP 130/62 | HR 70 | Wt 232.5 lb

## 2017-06-14 DIAGNOSIS — Z3A36 36 weeks gestation of pregnancy: Secondary | ICD-10-CM

## 2017-06-14 DIAGNOSIS — Z331 Pregnant state, incidental: Secondary | ICD-10-CM

## 2017-06-14 DIAGNOSIS — Z3483 Encounter for supervision of other normal pregnancy, third trimester: Secondary | ICD-10-CM

## 2017-06-14 DIAGNOSIS — Z1389 Encounter for screening for other disorder: Secondary | ICD-10-CM

## 2017-06-14 LAB — POCT URINALYSIS DIPSTICK
Blood, UA: NEGATIVE
Glucose, UA: NEGATIVE
KETONES UA: NEGATIVE
NITRITE UA: NEGATIVE
PROTEIN UA: NEGATIVE

## 2017-06-14 NOTE — Progress Notes (Signed)
Low-risk OB appointment G2P0010 3728w4d Estimated Date of Delivery: 07/08/17 BP 130/62   Pulse 70   Wt 232 lb 8 oz (105.5 kg)   LMP 10/01/2016 (Exact Date)   BMI 39.91 kg/m   BP, weight, and urine reviewed.  Refer to obstetrical flow sheet for FH & FHR.  Reports good fm.  Denies regular uc's, lof, vb, or uti s/s. No complaints. Here for bp check- bp normal.  GBS, gc/ct collected SVE per request: LTC, ballotable, vtx Reviewed labor s/s, pre-e s/s, fkc. Plan:  Continue routine obstetrical care  F/U in 1wk for OB appointment

## 2017-06-14 NOTE — Patient Instructions (Signed)
Call the office ([phone]) or go to [facility] if:  You begin to have strong, frequent contractions  Your water breaks.  Sometimes it is a big gush of fluid, sometimes it is just a trickle that keeps getting your panties wet or running down your legs  You have vaginal bleeding.  It is normal to have a small amount of spotting if your cervix was checked.   You don't feel your baby moving like normal.  If you don't, get you something to eat and drink and lay down and focus on feeling your baby move.  You should feel at least 10 movements in 2 hours.  If you don't, you should call the office or go to [facility].     Braxton Hicks Contractions Contractions of the uterus can occur throughout pregnancy, but they are not always a sign that you are in labor. You may have practice contractions called Braxton Hicks contractions. These false labor contractions are sometimes confused with true labor. What are Braxton Hicks contractions? Braxton Hicks contractions are tightening movements that occur in the muscles of the uterus before labor. Unlike true labor contractions, these contractions do not result in opening (dilation) and thinning of the cervix. Toward the end of pregnancy (32-34 weeks), Braxton Hicks contractions can happen more often and may become stronger. These contractions are sometimes difficult to tell apart from true labor because they can be very uncomfortable. You should not feel embarrassed if you go to the hospital with false labor. Sometimes, the only way to tell if you are in true labor is for your health care provider to look for changes in the cervix. The health care provider will do a physical exam and may monitor your contractions. If you are not in true labor, the exam should show that your cervix is not dilating and your water has not broken. If there are no prenatal problems or other health problems associated with your pregnancy, it is completely safe for you to be sent  home with false labor. You may continue to have Braxton Hicks contractions until you go into true labor. How can I tell the difference between true labor and false labor?  Differences ? False labor ? Contractions last 30-70 seconds.: Contractions are usually shorter and not as strong as true labor contractions. ? Contractions become very regular.: Contractions are usually irregular. ? Discomfort is usually felt in the top of the uterus, and it spreads to the lower abdomen and low back.: Contractions are often felt in the front of the lower abdomen and in the groin. ? Contractions do not go away with walking.: Contractions may go away when you walk around or change positions while lying down. ? Contractions usually become more intense and increase in frequency.: Contractions get weaker and are shorter-lasting as time goes on. ? The cervix dilates and gets thinner.: The cervix usually does not dilate or become thin. Follow these instructions at home:  Take over-the-counter and prescription medicines only as told by your health care provider.  Keep up with your usual exercises and follow other instructions from your health care provider.  Eat and drink lightly if you think you are going into labor.  If Braxton Hicks contractions are making you uncomfortable: ? Change your position from lying down or resting to walking, or change from walking to resting. ? Sit and rest in a tub of warm water. ? Drink enough fluid to keep your urine clear or pale yellow. Dehydration may cause these contractions. ?   Do slow and deep breathing several times an hour.  Keep all follow-up prenatal visits as told by your health care provider. This is important. Contact a health care provider if:  You have a fever.  You have continuous pain in your abdomen. Get help right away if:  Your contractions become stronger, more regular, and closer together.  You have fluid leaking or gushing from your vagina.  You  pass blood-tinged mucus (bloody show).  You have bleeding from your vagina.  You have low back pain that you never had before.  You feel your baby's head pushing down and causing pelvic pressure.  Your baby is not moving inside you as much as it used to. Summary  Contractions that occur before labor are called Braxton Hicks contractions, false labor, or practice contractions.  Braxton Hicks contractions are usually shorter, weaker, farther apart, and less regular than true labor contractions. True labor contractions usually become progressively stronger and regular and they become more frequent.  Manage discomfort from Braxton Hicks contractions by changing position, resting in a warm bath, drinking plenty of water, or practicing deep breathing. This information is not intended to replace advice given to you by your health care provider. Make sure you discuss any questions you have with your health care provider. Document Released: 10/30/2005 Document Revised: 09/18/2016 Document Reviewed: 09/18/2016 Elsevier Interactive Patient Education  2017 [employer]  

## 2017-06-17 LAB — GC/CHLAMYDIA PROBE AMP
Chlamydia trachomatis, NAA: NEGATIVE
Neisseria gonorrhoeae by PCR: NEGATIVE

## 2017-06-19 LAB — STREP GP B SUSCEPTIBILITY

## 2017-06-19 LAB — STREP GP B NAA+RFLX: Strep Gp B NAA+Rflx: POSITIVE — AB

## 2017-06-20 ENCOUNTER — Ambulatory Visit (INDEPENDENT_AMBULATORY_CARE_PROVIDER_SITE_OTHER): Payer: Medicaid Other | Admitting: Advanced Practice Midwife

## 2017-06-20 ENCOUNTER — Encounter: Payer: Self-pay | Admitting: Advanced Practice Midwife

## 2017-06-20 VITALS — BP 130/88 | HR 86 | Wt 230.5 lb

## 2017-06-20 DIAGNOSIS — Z1389 Encounter for screening for other disorder: Secondary | ICD-10-CM

## 2017-06-20 DIAGNOSIS — Z3A37 37 weeks gestation of pregnancy: Secondary | ICD-10-CM

## 2017-06-20 DIAGNOSIS — Z3483 Encounter for supervision of other normal pregnancy, third trimester: Secondary | ICD-10-CM

## 2017-06-20 DIAGNOSIS — Z331 Pregnant state, incidental: Secondary | ICD-10-CM

## 2017-06-20 LAB — POCT URINALYSIS DIPSTICK
Blood, UA: NEGATIVE
Glucose, UA: NEGATIVE
KETONES UA: NEGATIVE
Nitrite, UA: NEGATIVE

## 2017-06-20 NOTE — Progress Notes (Signed)
G2P0010 3087w3d Estimated Date of Delivery: 07/08/17  Blood pressure 130/88, pulse 86, weight 230 lb 8 oz (104.6 kg), last menstrual period 10/01/2016.   BP weight and urine results all reviewed and noted.  Please refer to the obstetrical flow sheet for the fundal height and fetal heart rate documentation:  Patient reports good fetal movement, denies any bleeding and no rupture of membranes symptoms or regular contractions. Patient is without complaints. All questions were answered.  Orders Placed This Encounter  Procedures  . POCT urinalysis dipstick    Plan:  Continued routine obstetrical care, watch BP so will see 2x/week  Return in about 1 week (around 06/27/2017) for LROB.

## 2017-06-22 ENCOUNTER — Encounter: Payer: Self-pay | Admitting: Obstetrics & Gynecology

## 2017-06-22 ENCOUNTER — Ambulatory Visit (INDEPENDENT_AMBULATORY_CARE_PROVIDER_SITE_OTHER): Payer: Medicaid Other | Admitting: Obstetrics & Gynecology

## 2017-06-22 VITALS — BP 112/72 | HR 77 | Wt 231.0 lb

## 2017-06-22 DIAGNOSIS — Z1389 Encounter for screening for other disorder: Secondary | ICD-10-CM

## 2017-06-22 DIAGNOSIS — Z331 Pregnant state, incidental: Secondary | ICD-10-CM

## 2017-06-22 DIAGNOSIS — Z3483 Encounter for supervision of other normal pregnancy, third trimester: Secondary | ICD-10-CM

## 2017-06-22 DIAGNOSIS — Z3A37 37 weeks gestation of pregnancy: Secondary | ICD-10-CM

## 2017-06-22 LAB — POCT URINALYSIS DIPSTICK
Blood, UA: NEGATIVE
Glucose, UA: NEGATIVE
Ketones, UA: NEGATIVE
LEUKOCYTES UA: NEGATIVE
NITRITE UA: NEGATIVE
PROTEIN UA: NEGATIVE

## 2017-06-22 NOTE — Progress Notes (Signed)
G2P0010 1131w5d Estimated Date of Delivery: 07/08/17  Blood pressure 112/72, pulse 77, weight 231 lb (104.8 kg), last menstrual period 10/01/2016.   BP weight and urine results all reviewed and noted.  Please refer to the obstetrical flow sheet for the fundal height and fetal heart rate documentation:  Patient reports good fetal movement, denies any bleeding and no rupture of membranes symptoms or regular contractions. Patient is without complaints. All questions were answered.  Orders Placed This Encounter  Procedures  . POCT Urinalysis Dipstick    Plan:  Continued routine obstetrical care,  BP is good today Has trapezius trigger point headaches, responded to local accu pressure  Return for keep scheduled.

## 2017-06-26 ENCOUNTER — Ambulatory Visit (INDEPENDENT_AMBULATORY_CARE_PROVIDER_SITE_OTHER): Payer: Medicaid Other | Admitting: Advanced Practice Midwife

## 2017-06-26 ENCOUNTER — Encounter: Payer: Self-pay | Admitting: Advanced Practice Midwife

## 2017-06-26 VITALS — BP 128/74 | HR 60 | Wt 232.0 lb

## 2017-06-26 DIAGNOSIS — Z3483 Encounter for supervision of other normal pregnancy, third trimester: Secondary | ICD-10-CM

## 2017-06-26 DIAGNOSIS — O98313 Other infections with a predominantly sexual mode of transmission complicating pregnancy, third trimester: Secondary | ICD-10-CM

## 2017-06-26 DIAGNOSIS — A5901 Trichomonal vulvovaginitis: Secondary | ICD-10-CM

## 2017-06-26 DIAGNOSIS — Z331 Pregnant state, incidental: Secondary | ICD-10-CM

## 2017-06-26 DIAGNOSIS — O23593 Infection of other part of genital tract in pregnancy, third trimester: Secondary | ICD-10-CM

## 2017-06-26 DIAGNOSIS — Z1389 Encounter for screening for other disorder: Secondary | ICD-10-CM

## 2017-06-26 DIAGNOSIS — O09893 Supervision of other high risk pregnancies, third trimester: Secondary | ICD-10-CM | POA: Diagnosis not present

## 2017-06-26 DIAGNOSIS — Z3A38 38 weeks gestation of pregnancy: Secondary | ICD-10-CM

## 2017-06-26 LAB — POCT URINALYSIS DIPSTICK
GLUCOSE UA: NEGATIVE
Ketones, UA: NEGATIVE
Leukocytes, UA: NEGATIVE
NITRITE UA: NEGATIVE
RBC UA: NEGATIVE

## 2017-06-26 NOTE — Progress Notes (Addendum)
G2P0010 6112w2d Estimated Date of Delivery: 07/08/17  Blood pressure 128/74, pulse 60, weight 232 lb (105.2 kg), last menstrual period 10/01/2016.   BP weight and urine results all reviewed and noted.  Please refer to the obstetrical flow sheet for the fundal height and fetal heart rate documentation:  Patient reports good fetal movement, denies any bleeding and no rupture of membranes symptoms or regular contractions. Patient is without complaints. All questions were answered.  Orders Placed This Encounter  Procedures  . Trichomonas vaginalis, RNA  . POCT urinalysis dipstick    Plan:  Continued routine obstetrical care, BP Completely normal last 2 visits; POC trich  Return in about 1 week (around 07/03/2017) for LROB.

## 2017-06-26 NOTE — Addendum Note (Signed)
Addended by: Jacklyn ShellRESENZO-DISHMON, Jovannie Ulibarri on: 06/26/2017 09:06 AM   Modules accepted: Orders

## 2017-06-28 ENCOUNTER — Encounter (HOSPITAL_COMMUNITY): Payer: Self-pay | Admitting: *Deleted

## 2017-06-28 ENCOUNTER — Inpatient Hospital Stay (HOSPITAL_COMMUNITY)
Admission: AD | Admit: 2017-06-28 | Discharge: 2017-06-28 | Disposition: A | Discharge status: Home / Self Care | Payer: Medicaid Other | Source: Ambulatory Visit | Attending: Family Medicine | Admitting: Family Medicine

## 2017-06-28 DIAGNOSIS — O471 False labor at or after 37 completed weeks of gestation: Secondary | ICD-10-CM | POA: Diagnosis not present

## 2017-06-28 DIAGNOSIS — Z3A39 39 weeks gestation of pregnancy: Secondary | ICD-10-CM | POA: Diagnosis not present

## 2017-06-28 DIAGNOSIS — O479 False labor, unspecified: Secondary | ICD-10-CM

## 2017-06-28 LAB — TRICHOMONAS VAGINALIS, PROBE AMP: Trich vag by NAA: NEGATIVE

## 2017-06-28 NOTE — Discharge Instructions (Signed)

## 2017-06-28 NOTE — MAU Note (Signed)
+  contractions since 9am Denies LOF or VB +FM  Last SVE at 36wks ; states was closed

## 2017-07-02 ENCOUNTER — Encounter (HOSPITAL_COMMUNITY): Payer: Self-pay | Admitting: *Deleted

## 2017-07-02 ENCOUNTER — Encounter (HOSPITAL_COMMUNITY)
Admission: AD | Disposition: A | Discharge status: Home / Self Care | Payer: Self-pay | Source: Ambulatory Visit | Attending: Obstetrics and Gynecology

## 2017-07-02 ENCOUNTER — Inpatient Hospital Stay (HOSPITAL_COMMUNITY): Payer: Medicaid Other | Admitting: Certified Registered Nurse Anesthetist

## 2017-07-02 ENCOUNTER — Inpatient Hospital Stay (HOSPITAL_COMMUNITY)
Admission: AD | Admit: 2017-07-02 | Discharge: 2017-07-04 | DRG: 766 | Disposition: A | Discharge status: Home / Self Care | Payer: Medicaid Other | Source: Ambulatory Visit | Attending: Obstetrics and Gynecology | Admitting: Obstetrics and Gynecology

## 2017-07-02 DIAGNOSIS — Z880 Allergy status to penicillin: Secondary | ICD-10-CM

## 2017-07-02 DIAGNOSIS — O9952 Diseases of the respiratory system complicating childbirth: Secondary | ICD-10-CM | POA: Diagnosis present

## 2017-07-02 DIAGNOSIS — O99214 Obesity complicating childbirth: Secondary | ICD-10-CM | POA: Diagnosis present

## 2017-07-02 DIAGNOSIS — Z3A36 36 weeks gestation of pregnancy: Secondary | ICD-10-CM

## 2017-07-02 DIAGNOSIS — Z3A39 39 weeks gestation of pregnancy: Secondary | ICD-10-CM | POA: Diagnosis not present

## 2017-07-02 DIAGNOSIS — O99824 Streptococcus B carrier state complicating childbirth: Secondary | ICD-10-CM | POA: Diagnosis present

## 2017-07-02 DIAGNOSIS — Z9889 Other specified postprocedural states: Secondary | ICD-10-CM

## 2017-07-02 DIAGNOSIS — Z87891 Personal history of nicotine dependence: Secondary | ICD-10-CM | POA: Diagnosis not present

## 2017-07-02 LAB — CBC
HCT: 35.8 % — ABNORMAL LOW (ref 36.0–46.0)
Hemoglobin: 12.2 g/dL (ref 12.0–15.0)
MCH: 30.8 pg (ref 26.0–34.0)
MCHC: 34.1 g/dL (ref 30.0–36.0)
MCV: 90.4 fL (ref 78.0–100.0)
PLATELETS: 236 10*3/uL (ref 150–400)
RBC: 3.96 MIL/uL (ref 3.87–5.11)
RDW: 14.2 % (ref 11.5–15.5)
WBC: 15.7 10*3/uL — ABNORMAL HIGH (ref 4.0–10.5)

## 2017-07-02 LAB — TYPE AND SCREEN
ABO/RH(D): A NEG
ANTIBODY SCREEN: NEGATIVE

## 2017-07-02 LAB — ABO/RH: ABO/RH(D): A NEG

## 2017-07-02 SURGERY — Surgical Case
Anesthesia: General

## 2017-07-02 MED ORDER — FENTANYL CITRATE (PF) 100 MCG/2ML IJ SOLN
INTRAMUSCULAR | Status: DC | PRN
  Administered 2017-07-02: 250 ug via INTRAVENOUS
  Administered 2017-07-02: 100 ug via INTRAVENOUS

## 2017-07-02 MED ORDER — MEPERIDINE HCL 25 MG/ML IJ SOLN: 6.2500 mg | INTRAMUSCULAR | Status: DC | PRN

## 2017-07-02 MED ORDER — OXYCODONE-ACETAMINOPHEN 5-325 MG PO TABS
1.0000 | ORAL_TABLET | ORAL | Status: DC | PRN
  Administered 2017-07-03: 1 via ORAL
  Filled 2017-07-02: qty 1

## 2017-07-02 MED ORDER — DIBUCAINE 1 % RE OINT
1.0000 | TOPICAL_OINTMENT | RECTAL | Status: DC | PRN
Start: 2017-07-02 — End: 2017-07-04

## 2017-07-02 MED ORDER — ONDANSETRON HCL 4 MG/2ML IJ SOLN
INTRAMUSCULAR | Status: AC
  Filled 2017-07-02: qty 2

## 2017-07-02 MED ORDER — MIDAZOLAM HCL 2 MG/2ML IJ SOLN
INTRAMUSCULAR | Status: AC
  Filled 2017-07-02: qty 2

## 2017-07-02 MED ORDER — LACTATED RINGERS IV SOLN
INTRAVENOUS | Status: DC
  Administered 2017-07-02: 21:00:00 via INTRAVENOUS

## 2017-07-02 MED ORDER — HYDROMORPHONE HCL 1 MG/ML IJ SOLN
INTRAMUSCULAR | Status: AC
  Filled 2017-07-02: qty 1

## 2017-07-02 MED ORDER — OXYCODONE-ACETAMINOPHEN 5-325 MG PO TABS: 2.0000 | ORAL_TABLET | ORAL | Status: DC | PRN

## 2017-07-02 MED ORDER — ZOLPIDEM TARTRATE 5 MG PO TABS: 5.0000 mg | ORAL_TABLET | Freq: Every evening | ORAL | Status: DC | PRN

## 2017-07-02 MED ORDER — MENTHOL 3 MG MT LOZG: 1.0000 | LOZENGE | OROMUCOSAL | Status: DC | PRN

## 2017-07-02 MED ORDER — MIDAZOLAM HCL 2 MG/2ML IJ SOLN
INTRAMUSCULAR | Status: DC | PRN
  Administered 2017-07-02: 2 mg via INTRAVENOUS

## 2017-07-02 MED ORDER — LACTATED RINGERS IV SOLN: 500.0000 mL | INTRAVENOUS | Status: DC | PRN

## 2017-07-02 MED ORDER — SOD CITRATE-CITRIC ACID 500-334 MG/5ML PO SOLN: 30.0000 mL | ORAL | Status: DC | PRN

## 2017-07-02 MED ORDER — TETANUS-DIPHTH-ACELL PERTUSSIS 5-2.5-18.5 LF-MCG/0.5 IM SUSP
0.5000 mL | Freq: Once | INTRAMUSCULAR | Status: AC
  Administered 2017-07-03: 0.5 mL via INTRAMUSCULAR
  Filled 2017-07-02: qty 0.5

## 2017-07-02 MED ORDER — OXYTOCIN BOLUS FROM INFUSION: 500.0000 mL | Freq: Once | INTRAVENOUS | Status: DC

## 2017-07-02 MED ORDER — PROMETHAZINE HCL 25 MG/ML IJ SOLN: 6.2500 mg | INTRAMUSCULAR | Status: DC | PRN

## 2017-07-02 MED ORDER — FENTANYL CITRATE (PF) 250 MCG/5ML IJ SOLN
INTRAMUSCULAR | Status: AC
  Filled 2017-07-02: qty 5

## 2017-07-02 MED ORDER — OXYTOCIN 40 UNITS IN LACTATED RINGERS INFUSION - SIMPLE MED: 2.5000 [IU]/h | INTRAVENOUS | Status: DC

## 2017-07-02 MED ORDER — ACETAMINOPHEN 325 MG PO TABS: 650.0000 mg | ORAL_TABLET | ORAL | Status: DC | PRN

## 2017-07-02 MED ORDER — KETOROLAC TROMETHAMINE 30 MG/ML IJ SOLN
30.0000 mg | Freq: Once | INTRAMUSCULAR | Status: AC
  Administered 2017-07-02: 30 mg via INTRAVENOUS
  Filled 2017-07-02: qty 1

## 2017-07-02 MED ORDER — SIMETHICONE 80 MG PO CHEW
80.0000 mg | CHEWABLE_TABLET | Freq: Three times a day (TID) | ORAL | Status: DC
  Administered 2017-07-03 – 2017-07-04 (×4): 80 mg via ORAL
  Filled 2017-07-02 (×4): qty 1

## 2017-07-02 MED ORDER — COCONUT OIL OIL
1.0000 "application " | TOPICAL_OIL | Status: DC | PRN
  Administered 2017-07-03: 1 via TOPICAL
  Filled 2017-07-02: qty 120

## 2017-07-02 MED ORDER — SUCCINYLCHOLINE CHLORIDE 200 MG/10ML IV SOSY
PREFILLED_SYRINGE | INTRAVENOUS | Status: AC
  Filled 2017-07-02: qty 10

## 2017-07-02 MED ORDER — DEXAMETHASONE SODIUM PHOSPHATE 10 MG/ML IJ SOLN
INTRAMUSCULAR | Status: DC | PRN
  Administered 2017-07-02: 4 mg via INTRAVENOUS

## 2017-07-02 MED ORDER — DEXAMETHASONE SODIUM PHOSPHATE 10 MG/ML IJ SOLN
INTRAMUSCULAR | Status: AC
  Filled 2017-07-02: qty 1

## 2017-07-02 MED ORDER — ONDANSETRON HCL 4 MG/2ML IJ SOLN
INTRAMUSCULAR | Status: DC | PRN
  Administered 2017-07-02: 4 mg via INTRAVENOUS

## 2017-07-02 MED ORDER — DIPHENHYDRAMINE HCL 25 MG PO CAPS: 25.0000 mg | ORAL_CAPSULE | Freq: Four times a day (QID) | ORAL | Status: DC | PRN

## 2017-07-02 MED ORDER — OXYTOCIN 10 UNIT/ML IJ SOLN
INTRAMUSCULAR | Status: AC
  Filled 2017-07-02: qty 4

## 2017-07-02 MED ORDER — PROPOFOL 10 MG/ML IV BOLUS
INTRAVENOUS | Status: AC
  Filled 2017-07-02: qty 40

## 2017-07-02 MED ORDER — OXYTOCIN 40 UNITS IN LACTATED RINGERS INFUSION - SIMPLE MED
INTRAVENOUS | Status: DC | PRN
  Administered 2017-07-02: 40 mL via INTRAVENOUS

## 2017-07-02 MED ORDER — ONDANSETRON HCL 4 MG/2ML IJ SOLN: 4.0000 mg | Freq: Four times a day (QID) | INTRAMUSCULAR | Status: DC | PRN

## 2017-07-02 MED ORDER — PROPOFOL 10 MG/ML IV BOLUS
INTRAVENOUS | Status: DC | PRN
  Administered 2017-07-02: 200 mg via INTRAVENOUS

## 2017-07-02 MED ORDER — SIMETHICONE 80 MG PO CHEW: 80.0000 mg | CHEWABLE_TABLET | ORAL | Status: DC | PRN

## 2017-07-02 MED ORDER — SUCCINYLCHOLINE CHLORIDE 20 MG/ML IJ SOLN
INTRAMUSCULAR | Status: DC | PRN
  Administered 2017-07-02: 120 mg via INTRAVENOUS

## 2017-07-02 MED ORDER — CLINDAMYCIN PHOSPHATE 900 MG/50ML IV SOLN
900.0000 mg | Freq: Three times a day (TID) | INTRAVENOUS | Status: DC
  Administered 2017-07-02: 900 mg via INTRAVENOUS
  Filled 2017-07-02: qty 50

## 2017-07-02 MED ORDER — SIMETHICONE 80 MG PO CHEW
80.0000 mg | CHEWABLE_TABLET | ORAL | Status: DC
  Administered 2017-07-03 (×2): 80 mg via ORAL
  Filled 2017-07-02 (×2): qty 1

## 2017-07-02 MED ORDER — WITCH HAZEL-GLYCERIN EX PADS: 1.0000 "application " | MEDICATED_PAD | CUTANEOUS | Status: DC | PRN

## 2017-07-02 MED ORDER — FENTANYL CITRATE (PF) 100 MCG/2ML IJ SOLN
INTRAMUSCULAR | Status: AC
  Filled 2017-07-02: qty 2

## 2017-07-02 MED ORDER — LACTATED RINGERS IV SOLN
INTRAVENOUS | Status: DC
  Administered 2017-07-02 (×2): via INTRAVENOUS

## 2017-07-02 MED ORDER — LACTATED RINGERS IV SOLN
INTRAVENOUS | Status: DC | PRN
  Administered 2017-07-02: 12:00:00 via INTRAVENOUS

## 2017-07-02 MED ORDER — KETOROLAC TROMETHAMINE 30 MG/ML IJ SOLN: 30.0000 mg | Freq: Once | INTRAMUSCULAR | Status: DC | PRN

## 2017-07-02 MED ORDER — IBUPROFEN 600 MG PO TABS
600.0000 mg | ORAL_TABLET | Freq: Four times a day (QID) | ORAL | Status: DC
  Administered 2017-07-03 – 2017-07-04 (×6): 600 mg via ORAL
  Filled 2017-07-02 (×6): qty 1

## 2017-07-02 MED ORDER — OXYTOCIN 40 UNITS IN LACTATED RINGERS INFUSION - SIMPLE MED: 2.5000 [IU]/h | INTRAVENOUS | Status: AC

## 2017-07-02 MED ORDER — OXYCODONE-ACETAMINOPHEN 5-325 MG PO TABS: 1.0000 | ORAL_TABLET | ORAL | Status: DC | PRN

## 2017-07-02 MED ORDER — HYDROMORPHONE HCL 1 MG/ML IJ SOLN
0.2500 mg | INTRAMUSCULAR | Status: DC | PRN
  Administered 2017-07-02 (×4): 0.5 mg via INTRAVENOUS

## 2017-07-02 MED ORDER — LIDOCAINE HCL (PF) 1 % IJ SOLN: 30.0000 mL | INTRAMUSCULAR | Status: DC | PRN

## 2017-07-02 MED ORDER — SENNOSIDES-DOCUSATE SODIUM 8.6-50 MG PO TABS
2.0000 | ORAL_TABLET | ORAL | Status: DC
  Administered 2017-07-03 (×2): 2 via ORAL
  Filled 2017-07-02 (×2): qty 2

## 2017-07-02 MED ORDER — PRENATAL MULTIVITAMIN CH
1.0000 | ORAL_TABLET | Freq: Every day | ORAL | Status: DC
  Administered 2017-07-03 – 2017-07-04 (×2): 1 via ORAL
  Filled 2017-07-02 (×2): qty 1

## 2017-07-02 SURGICAL SUPPLY — 47 items
APL SKNCLS STERI-STRIP NONHPOA (GAUZE/BANDAGES/DRESSINGS) ×1
BENZOIN TINCTURE PRP APPL 2/3 (GAUZE/BANDAGES/DRESSINGS) ×3 IMPLANT
CHLORAPREP W/TINT 26ML (MISCELLANEOUS) ×3 IMPLANT
CLAMP CORD UMBIL (MISCELLANEOUS) IMPLANT
CLOSURE STERI STRIP 1/2 X4 (GAUZE/BANDAGES/DRESSINGS) ×1 IMPLANT
CLOSURE WOUND 1/2 X4 (GAUZE/BANDAGES/DRESSINGS) ×1
CLOTH BEACON ORANGE TIMEOUT ST (SAFETY) ×3 IMPLANT
DRAPE C SECTION CLR SCREEN (DRAPES) IMPLANT
DRSG OPSITE POSTOP 4X10 (GAUZE/BANDAGES/DRESSINGS) ×3 IMPLANT
ELECT REM PT RETURN 9FT ADLT (ELECTROSURGICAL) ×3
ELECTRODE REM PT RTRN 9FT ADLT (ELECTROSURGICAL) ×1 IMPLANT
EXTRACTOR VACUUM M CUP 4 TUBE (SUCTIONS) IMPLANT
EXTRACTOR VACUUM M CUP 4' TUBE (SUCTIONS)
GLOVE BIO SURGEON STRL SZ7.5 (GLOVE) ×3 IMPLANT
GLOVE BIOGEL PI IND STRL 7.0 (GLOVE) ×1 IMPLANT
GLOVE BIOGEL PI INDICATOR 7.0 (GLOVE) ×2
GOWN STRL REUS W/TWL 2XL LVL3 (GOWN DISPOSABLE) ×3 IMPLANT
GOWN STRL REUS W/TWL LRG LVL3 (GOWN DISPOSABLE) ×6 IMPLANT
KIT ABG SYR 3ML LUER SLIP (SYRINGE) IMPLANT
NDL HYPO 25X5/8 SAFETYGLIDE (NEEDLE) IMPLANT
NEEDLE HYPO 22GX1.5 SAFETY (NEEDLE) ×3 IMPLANT
NEEDLE HYPO 25X5/8 SAFETYGLIDE (NEEDLE) IMPLANT
NS IRRIG 1000ML POUR BTL (IV SOLUTION) ×3 IMPLANT
PACK C SECTION WH (CUSTOM PROCEDURE TRAY) ×3 IMPLANT
PAD OB MATERNITY 4.3X12.25 (PERSONAL CARE ITEMS) ×3 IMPLANT
PENCIL SMOKE EVAC W/HOLSTER (ELECTROSURGICAL) ×3 IMPLANT
RTRCTR C-SECT PINK 25CM LRG (MISCELLANEOUS) ×3 IMPLANT
SPONGE LAP 18X18 X RAY DECT (DISPOSABLE) ×2 IMPLANT
STRIP CLOSURE SKIN 1/2X4 (GAUZE/BANDAGES/DRESSINGS) ×2 IMPLANT
SUT CHROMIC 1 CTX 36 (SUTURE) ×6 IMPLANT
SUT VIC AB 0 CT1 27 (SUTURE) ×3
SUT VIC AB 0 CT1 27XBRD ANBCTR (SUTURE) IMPLANT
SUT VIC AB 0 CTX 36 (SUTURE) ×6
SUT VIC AB 0 CTX36XBRD ANBCTRL (SUTURE) IMPLANT
SUT VIC AB 1 CT1 27 (SUTURE) ×9
SUT VIC AB 1 CT1 27XBRD ANTBC (SUTURE) ×2 IMPLANT
SUT VIC AB 2-0 CT1 (SUTURE) ×3 IMPLANT
SUT VIC AB 2-0 CT1 27 (SUTURE) ×3
SUT VIC AB 2-0 CT1 TAPERPNT 27 (SUTURE) ×1 IMPLANT
SUT VIC AB 3-0 CT1 27 (SUTURE) ×6
SUT VIC AB 3-0 CT1 TAPERPNT 27 (SUTURE) ×2 IMPLANT
SUT VIC AB 3-0 SH 27 (SUTURE)
SUT VIC AB 3-0 SH 27X BRD (SUTURE) IMPLANT
SUT VIC AB 4-0 KS 27 (SUTURE) ×5 IMPLANT
SYR BULB IRRIGATION 50ML (SYRINGE) IMPLANT
TOWEL OR 17X24 6PK STRL BLUE (TOWEL DISPOSABLE) ×3 IMPLANT
TRAY FOLEY BAG SILVER LF 14FR (SET/KITS/TRAYS/PACK) ×3 IMPLANT

## 2017-07-02 NOTE — Anesthesia Postprocedure Evaluation (Signed)
Anesthesia Post Note  Patient: Angelica Henry  Procedure(s) Performed: Procedure(s) (LRB): CESAREAN SECTION (N/A)     Anesthesia Post Evaluation  Last Vitals:  Vitals:   07/02/17 1500 07/02/17 1515  BP: 135/86 120/85  Pulse: 73 74  Resp: 16 (!) 24  Temp:  37.2 C  SpO2: 98% 97%    Last Pain:  Vitals:   07/02/17 1500  PainSc: 4    Pain Goal: Patients Stated Pain Goal: 4 (07/02/17 1500)               Dallis Darden A.

## 2017-07-02 NOTE — Anesthesia Preprocedure Evaluation (Addendum)
Anesthesia Evaluation  Patient identified by MRN, date of birth, ID band Patient awake    Reviewed: Allergy & Precautions, NPO status , Patient's Chart, lab work & pertinent test results  Airway Mallampati: II       Dental no notable dental hx. (+) Teeth Intact   Pulmonary former smoker,    Pulmonary exam normal        Cardiovascular Normal cardiovascular exam Rhythm:Regular Rate:Normal     Neuro/Psych    GI/Hepatic   Endo/Other  Morbid obesity  Renal/GU      Musculoskeletal   Abdominal (+) + obese,   Peds  Hematology   Anesthesia Other Findings   Reproductive/Obstetrics (+) Pregnancy                             Anesthesia Physical Anesthesia Plan  ASA: III and emergent  Anesthesia Plan: General   Post-op Pain Management:    Induction:   PONV Risk Score and Plan: 4 or greater and Ondansetron, Dexamethasone, Midazolam, Scopolamine patch - Pre-op, Propofol infusion and Treatment may vary due to age or medical condition  Airway Management Planned: Oral ETT  Additional Equipment:   Intra-op Plan:   Post-operative Plan: Extubation in OR  Informed Consent:   Plan Discussed with: CRNA  Anesthesia Plan Comments:         Anesthesia Quick Evaluation

## 2017-07-02 NOTE — Transfer of Care (Signed)
Immediate Anesthesia Transfer of Care Note  Patient: Angelica Henry  Procedure(s) Performed: Procedure(s): CESAREAN SECTION (N/A)  Patient Location: PACU  Anesthesia Type:General  Level of Consciousness: awake, alert  and oriented  Airway & Oxygen Therapy: Patient Spontanous Breathing and Patient connected to nasal cannula oxygen  Post-op Assessment: Report given to RN and Post -op Vital signs reviewed and stable  Post vital signs: Reviewed and stable  Last Vitals:  Vitals:   07/02/17 1500 07/02/17 1515  BP: 135/86 120/85  Pulse: 73 74  Resp: 16 (!) 24  Temp:  37.2 C  SpO2: 98% 97%    Last Pain:  Vitals:   07/02/17 1500  PainSc: 4       Patients Stated Pain Goal: 4 (07/02/17 1500)  Complications: No apparent anesthesia complications

## 2017-07-02 NOTE — MAU Note (Signed)
Pt states water broke on her way here, very uncomfortable, FHR down, Blanche East, NP, Carloyn Jaeger CNM & Dr. Alysia Penna @ bedside.

## 2017-07-02 NOTE — Anesthesia Postprocedure Evaluation (Signed)
Anesthesia Post Note  Patient: Angelica Henry  Procedure(s) Performed: Procedure(s) (LRB): CESAREAN SECTION (N/A)     Patient location during evaluation: Mother Baby Anesthesia Type: General Level of consciousness: awake and alert and oriented Pain management: pain level controlled Vital Signs Assessment: post-procedure vital signs reviewed and stable Respiratory status: spontaneous breathing and nonlabored ventilation Cardiovascular status: stable Postop Assessment: no signs of nausea or vomiting and adequate PO intake Anesthetic complications: no    Last Vitals:  Vitals:   07/02/17 1515 07/02/17 1535  BP: 120/85 119/83  Pulse: 74 70  Resp: (!) 24 18  Temp: 37.2 C 37.2 C  SpO2: 97% 96%    Last Pain:  Vitals:   07/02/17 1535  TempSrc: Oral  PainSc: 3    Pain Goal: Patients Stated Pain Goal: 4 (07/02/17 1500)               Lola Lofaro Hristova

## 2017-07-02 NOTE — Anesthesia Procedure Notes (Signed)
Procedure Name: Intubation Date/Time: 07/02/2017 12:24 PM Performed by: Yolonda Kida Pre-anesthesia Checklist: Patient identified, Emergency Drugs available, Suction available, Patient being monitored and Timeout performed Patient Re-evaluated:Patient Re-evaluated prior to induction Oxygen Delivery Method: Circle system utilized Preoxygenation: Pre-oxygenation with 100% oxygen Induction Type: IV induction, Cricoid Pressure applied and Rapid sequence Laryngoscope Size: Miller and 2 Grade View: Grade I Tube type: Oral Number of attempts: 1 Airway Equipment and Method: Stylet Secured at: 21 cm Tube secured with: Tape Dental Injury: Teeth and Oropharynx as per pre-operative assessment

## 2017-07-02 NOTE — H&P (Signed)
Angelica Henry is a 26 y.o. female G1P0 IUP 39 weeks  presenting for c/o ut ctx. SROM while enroute to MAU. On presentation FHT's noted to be in the 90's. I was call to access. On my arrival to MAU # 2, FHT's noted to be in the 90's, cervical exam as noted in PE. O2 had already been started, IV access obtained, position changed several times. FHT's remained unchanged. Decision to proceed with stat c section. Code cesarean called. Pt was briefed on the indications and risks on c section. Pt was taken to OR # 9.  OB History    Gravida Para Term Preterm AB Living   2 1 1  0 1 1   SAB TAB Ectopic Multiple Live Births   1 0 0 0 1     Past Medical History:  Diagnosis Date  . Asthma   . Dyspareunia 04/19/2015  . Herpes simplex virus (HSV) infection   . Vaginal Pap smear, abnormal    Past Surgical History:  Procedure Laterality Date  . cervical dilatation and uterine curettage    . DILATION AND EVACUATION N/A 08/02/2016   Procedure: CERVICAL DILATATION WITH SUCTION AND SHARP UTERINE CURETTAGE;  Surgeon: Lazaro Arms, MD;  Location: [facility];  Service: Gynecology;  Laterality: N/A;   Family History: family history includes Cancer in her maternal grandfather and mother; Cirrhosis in her maternal grandfather; Glaucoma in her maternal grandmother; Heart disease in her father and maternal grandmother; Other in her paternal grandfather and sister. Social History:  reports that she has quit smoking. Her smoking use included Cigarettes. She quit after 11.00 years of use. She has never used smokeless tobacco. She reports that she does not drink alcohol or use drugs.     Maternal Diabetes: No Genetic Screening: Normal Maternal Ultrasounds/Referrals: Abnormal:  Findings:   Isolated EIF (echogenic intracardiac focus) Fetal Ultrasounds or other Referrals:  None Maternal Substance Abuse:  No Significant Maternal Medications:  None Significant Maternal Lab Results:  Lab values include: Group B Strep  positive Other Comments:  None  Review of Systems  Constitutional: Negative.   Respiratory: Negative.   Cardiovascular: Negative.   Gastrointestinal: Negative.   Genitourinary: Negative.    History Dilation: 5 Effacement (%): 90 Station: -1 Exam by:: R. Arita Miss CNM Blood pressure 105/86, pulse 77, temperature 97.7 F (36.5 C), resp. rate 20, last menstrual period 10/01/2016, SpO2 97 %, unknown if currently breastfeeding. Exam Physical Exam  Constitutional: She appears well-developed and well-nourished.  Pain with ut ctx  Cardiovascular: Normal rate and regular rhythm.   Respiratory: Effort normal and breath sounds normal.  GI: Soft. Bowel sounds are normal.  Gravid FH=GA  Genitourinary:  Genitourinary Comments: 5/-1 per midwife, bloody show    Prenatal labs: ABO, Rh: --/--/A NEG (08/20 1200) Antibody: NEG (08/20 1200) Rubella: 3.65 (02/19 1527) RPR: Non Reactive (06/04 0913)  HBsAg: Negative (02/19 1527)  HIV:    GBS:     Assessment/Plan: IUP 39 weeks NRFHT's  Code cesarean called. To OR # 9.   Angelica Henry 07/02/2017, 1:42 PM

## 2017-07-02 NOTE — Op Note (Signed)
Cesarean Section Procedure Note  07/02/2017  1:54 PM  PATIENT:  Angelica Henry  26 y.o. female  PRE-OPERATIVE DIAGNOSIS:  intrauterine pregnancy  39 wk stat cesarean section for fetal heart rate  POST-OPERATIVE DIAGNOSIS: stat  primary cesarean section for fetal heart rate  PROCEDURE:  Procedure(s): CESAREAN SECTION (N/A)  SURGEON:  Surgeon(s) and Role:    * [name], MD - Primary  ASSISTANTS: none   ANESTHESIA:   general  EBL:  Total I/O In: 1900 [I.V.:1900] Out: 1000 [Urine:100; Blood:900]  BLOOD ADMINISTERED:none  DRAINS: none   LOCAL MEDICATIONS USED:  NONE  SPECIMEN:  Source of Specimen:  uterus  DISPOSITION OF SPECIMEN:  PATHOLOGY   Procedure Details   The patient was taken to Operating Room # 9 for stat c section secondary to NRFHT's.  The pt was prepped and draped in the usual sterile fashion in a stat manner. GETA was obtained and once OK was given by anesthesia a Pfannenstiel incision was made through the subcutanous tissue and fascia. The fascia bluntly disceted away from the underlying rectus tissue. The peritoneal cavity was entered bluntly. A low transverse uterine incision was made at 12:27. A viable female infant was delivered at 12:28. Cord was clamp and cut. Infant noted to have good color, tone and cry. Infant to NICU attendants.   The placenta then was extracted and cavity was cleaned of all products of conception. The uterine incision was closed with 0 Vicryl. A second layer of the same was used to complete a 2 layer closure. Interrupted sutures of the same where used to obtain hemostasis.   The perineum and abd muscle were closed with 2/0 Vicryl. The fascia was closed with # 1 Vicryl from conner to midline and conner to midline.  The subcutaneous tissue was closed with 2/0 Vicryl. The skin was closed with 4/0 Vicryl. Dressing was applied. All counts were correct x 2. Pt was extubated and taken to PACU in stable condition.    Complications:   None; patient tolerated the procedure well.  COUNTS:  YES  PLAN OF CARE: Admit to inpatient   PATIENT DISPOSITION:  PACU - hemodynamically stable.   Delay start of Pharmacological VTE agent (>24hrs) due to surgical blood loss or risk of bleeding: not applicable             Disposition: PACU - hemodynamically stable.         Condition: stable   [name], MD 07/02/2017 1:54 PM

## 2017-07-02 NOTE — Progress Notes (Signed)
Dr. Alysia Penna @ bedside, decision for stat C/S made, pt to OR by stretcher.

## 2017-07-03 LAB — CBC
HCT: 25.7 % — ABNORMAL LOW (ref 36.0–46.0)
Hemoglobin: 8.9 g/dL — ABNORMAL LOW (ref 12.0–15.0)
MCH: 31.2 pg (ref 26.0–34.0)
MCHC: 34.6 g/dL (ref 30.0–36.0)
MCV: 90.2 fL (ref 78.0–100.0)
PLATELETS: 205 10*3/uL (ref 150–400)
RBC: 2.85 MIL/uL — AB (ref 3.87–5.11)
RDW: 14.1 % (ref 11.5–15.5)
WBC: 19.4 10*3/uL — ABNORMAL HIGH (ref 4.0–10.5)

## 2017-07-03 LAB — RPR: RPR Ser Ql: NONREACTIVE

## 2017-07-03 MED ORDER — RHO D IMMUNE GLOBULIN 1500 UNIT/2ML IJ SOSY
300.0000 ug | PREFILLED_SYRINGE | Freq: Once | INTRAMUSCULAR | Status: AC
  Administered 2017-07-03: 300 ug via INTRAVENOUS
  Filled 2017-07-03: qty 2

## 2017-07-03 MED ORDER — FERROUS FUMARATE 324 (106 FE) MG PO TABS
1.0000 | ORAL_TABLET | Freq: Two times a day (BID) | ORAL | Status: DC
  Administered 2017-07-03 – 2017-07-04 (×3): 106 mg via ORAL
  Filled 2017-07-03 (×5): qty 1

## 2017-07-03 NOTE — Progress Notes (Signed)
Post OP DAY #1 Subjective: no complaints, up ad lib, voiding and tolerating PO  Objective: Blood pressure 135/71, pulse 76, temperature 98.7 F (37.1 C), temperature source Oral, resp. rate 18, last menstrual period 10/01/2016, SpO2 99 %, unknown if currently breastfeeding.  Physical Exam:  General: alert Lochia: appropriate Uterine Fundus: firm and appropriately tender at U-1 Honeycomb dressing: c/d/i DVT Evaluation: No evidence of DVT seen on physical exam, SCDs still on   Recent Labs  07/02/17 1200 07/03/17 0536  HGB 12.2 8.9*  HCT 35.8* 25.7*    Assessment/Plan: Plan for discharge tomorrow   LOS: 1 day   Tameisha Covell C Mihika Surrette 07/03/2017, 7:32 AM

## 2017-07-03 NOTE — Lactation Note (Signed)
This note was copied from a baby's chart. Lactation Consultation Note  Patient Name: Angelica Henry PSUGA'Y Date: 07/03/2017  Breastfeeding consultation services and support information given to mom.  This is her first baby and newborn is 22 hours old.  Baby has been latching to left breast using cradle hold but mom is having difficulty on right side.  Assisted mom with positioning on right using football hold.  Baby latched easily and mom comfortable.  Reviewed basics with mom.  Discussed cluster feeding.  Lactation phone number left to call for assist prn.   Maternal Data    Feeding Feeding Type: Breast Milk Length of feed: 15 min  LATCH Score Latch: Grasps breast easily, tongue down, lips flanged, rhythmical sucking.  Audible Swallowing: Spontaneous and intermittent  Type of Nipple: Everted at rest and after stimulation  Comfort (Breast/Nipple): Filling, red/small blisters or bruises, mild/mod discomfort  Hold (Positioning): Assistance needed to correctly position infant at breast and maintain latch.  LATCH Score: 8  Interventions    Lactation Tools Discussed/Used     Consult Status      Huston Foley 07/03/2017, 9:53 AM

## 2017-07-03 NOTE — Progress Notes (Signed)
MOB was referred for history of depression/anxiety. * Referral screened out by Clinical Social Worker because none of the following criteria appear to apply: ~ History of anxiety/depression during this pregnancy, or of post-partum depression. ~ Diagnosis of anxiety and/or depression within last 3 years OR * MOB's symptoms currently being treated with medication and/or therapy.  Please contact the Clinical Social Worker if needs arise, by MOB request, or if MOB scores 9 or greater/yes to question 10 on Edinburgh Postpartum Depression Screen.  CSW received consult for hx of marijuana use.  Referral was screened out due to the following: ~MOB had no documented substance use after initial prenatal visit/+UPT. ~MOB had no positive drug screens after initial prenatal visit/+UPT. ~Baby's UDS is negative.  Please consult CSW if current concerns arise or by MOB's request.  CSW will monitor CDS results and make report to Child Protective Services if warranted.  There are no barriers to d/c.   Angelica Henry, MSW, LCSW Clinical Social Work [phone]    

## 2017-07-04 ENCOUNTER — Encounter (HOSPITAL_COMMUNITY): Payer: Self-pay | Admitting: *Deleted

## 2017-07-04 ENCOUNTER — Encounter: Payer: Medicaid Other | Admitting: Advanced Practice Midwife

## 2017-07-04 LAB — RH IG WORKUP (INCLUDES ABO/RH)
ABO/RH(D): A NEG
Fetal Screen: NEGATIVE
Gestational Age(Wks): 39.1
Unit division: 0

## 2017-07-04 MED ORDER — OXYCODONE-ACETAMINOPHEN 5-325 MG PO TABS: 2.0000 | ORAL_TABLET | ORAL | 0 refills | Status: DC | PRN

## 2017-07-04 MED ORDER — IBUPROFEN 600 MG PO TABS: 600.0000 mg | ORAL_TABLET | Freq: Four times a day (QID) | ORAL | 0 refills | Status: AC

## 2017-07-04 NOTE — Discharge Summary (Signed)
OB Discharge Summary  Patient Name: Angelica Henry DOB: February 21, 1991 MRN: [id number]  Date of admission: 07/02/2017 Delivering MD: [name]   Date of discharge: 07/04/2017  Admitting diagnosis: WATER BROKE Intrauterine pregnancy: [redacted]w[redacted]d     Secondary diagnosis:Active Problems:   Labor and delivery indication for care or intervention   Post-operative state  Additional problems: obesity     Discharge diagnosis: Term Pregnancy Delivered                                                                      Complications: None  Hospital course:  Arrival at [facility] with non- reassuring FHR  Physical exam  Vitals:   07/03/17 1230 07/03/17 1820 07/04/17 0100 07/04/17 0554  BP: 126/67 126/72  (!) 143/69  Pulse: 87   80  Resp: 20 18  18   Temp: 98.6 F (37 C) 98.3 F (36.8 C)  98.3 F (36.8 C)  TempSrc: Oral   Oral  SpO2: 98%     Weight:   105.2 kg (232 lb)   Height:   5\' 4"  (1.626 m)    General: alert Lochia: appropriate Uterine Fundus: firm Incision: Dressing is clean, dry, and intact DVT Evaluation: No evidence of DVT seen on physical exam. Labs: Lab Results  Component Value Date   WBC 19.4 (H) 07/03/2017   HGB 8.9 (L) 07/03/2017   HCT 25.7 (L) 07/03/2017   MCV 90.2 07/03/2017   PLT 205 07/03/2017   CMP Latest Ref Rng & Units 07/31/2016  Glucose 65 - 99 mg/dL 92  BUN 6 - 20 mg/dL 7  Creatinine 1.91 - 4.78 mg/dL 2.95  Sodium 621 - 308 mmol/L 138  Potassium 3.5 - 5.1 mmol/L 3.7  Chloride 101 - 111 mmol/L 110  CO2 22 - 32 mmol/L 25  Calcium 8.9 - 10.3 mg/dL 8.9  Total Protein 6.5 - 8.1 g/dL 7.4  Total Bilirubin 0.3 - 1.2 mg/dL 0.3  Alkaline Phos 38 - 126 U/L 73  AST 15 - 41 U/L 12(L)  ALT 14 - 54 U/L 12(L)    Discharge instruction: per After Visit Summary and "Baby and Me Booklet".  After Visit Meds:  Allergies as of 07/04/2017      Reactions   Penicillins Other (See Comments)   Flushing Has patient had a PCN reaction causing immediate rash,  facial/tongue/throat swelling, SOB or lightheadedness with hypotension: No Has patient had a PCN reaction causing severe rash involving mucus membranes or skin necrosis: No Has patient had a PCN reaction that required hospitalization: No Has patient had a PCN reaction occurring within the last 10 years: No If all of the above answers are "NO", then may proceed with Cephalosporin use.      Medication List    STOP taking these medications   acetaminophen 325 MG tablet Commonly known as:  TYLENOL     TAKE these medications   calcium carbonate 500 MG chewable tablet Commonly known as:  TUMS - dosed in mg elemental calcium Chew 1 tablet by mouth 2 (two) times daily as needed for indigestion or heartburn.   ibuprofen 600 MG tablet Commonly known as:  ADVIL,MOTRIN Take 1 tablet (600 mg total) by mouth every 6 (six) hours.  oxyCODONE-acetaminophen 5-325 MG tablet Commonly known as:  PERCOCET/ROXICET Take 2 tablets by mouth every 4 (four) hours as needed (pain scale > 7).   Prenatal Vitamins 0.8 MG tablet Take 1 tablet by mouth daily.       Diet: routine diet  Activity: Advance as tolerated. Pelvic rest for 6 weeks.   Outpatient follow up:1 week Follow up Appt:Future Appointments Date Time Provider Department Center  07/10/2017 12:00 PM Cresenzo-Dishmon, Scarlette Calico, CNM [facility]   Follow up visit: No Follow-up on file.  Postpartum contraception: Nexplanon  Newborn Data: Live born female  Birth Weight: 6 lb 14.9 oz (3145 g) APGAR: 9, 9  Baby Feeding: Bottle Disposition:home with mother   07/04/2017 Allie Bossier, MD

## 2017-07-04 NOTE — Discharge Instructions (Signed)

## 2017-07-04 NOTE — Lactation Note (Signed)
This note was copied from a baby's chart. Lactation Consultation Note  Patient Name: Angelica Henry YOKHT'X Date: 07/04/2017 Reason for consult: Follow-up assessment  Baby 46 hours old. Mom latching baby to left breast in cross-cradle position. Mom reports some nipple discomfort. Demonstrated how to flange baby's lower lip and mom reported increased comfort. Mom states some difficulty with latching baby to right breast. Discussed positioning--having baby in football since cross-cradle working well on left breast. Enc hand expressing drops of EBM for baby to taste prior to latching, and reminded to make sure both lips well flanged. Mom reports that her breasts are starting to fill and she is hearing swallows while baby at breast. Discussed engorgement prevention/treatment and mom aware of OP/BFSG and LC phone line assistance after D/C.   Maternal Data    Feeding Feeding Type: Breast Fed  LATCH Score Latch: Grasps breast easily, tongue down, lips flanged, rhythmical sucking.  Audible Swallowing: A few with stimulation  Type of Nipple: Everted at rest and after stimulation  Comfort (Breast/Nipple): Filling, red/small blisters or bruises, mild/mod discomfort  Hold (Positioning): Assistance needed to correctly position infant at breast and maintain latch.  LATCH Score: 7  Interventions Interventions: Assisted with latch;Adjust position;Position options  Lactation Tools Discussed/Used     Consult Status Consult Status: Follow-up Date: 07/05/17 Follow-up type: In-patient    Sherlyn Hay 07/04/2017, 11:07 AM

## 2017-07-10 ENCOUNTER — Encounter: Payer: Self-pay | Admitting: Advanced Practice Midwife

## 2017-07-10 ENCOUNTER — Ambulatory Visit (INDEPENDENT_AMBULATORY_CARE_PROVIDER_SITE_OTHER): Payer: Medicaid Other | Admitting: Advanced Practice Midwife

## 2017-07-10 VITALS — BP 126/80 | HR 72 | Ht 65.0 in | Wt 212.0 lb

## 2017-07-10 DIAGNOSIS — Z9889 Other specified postprocedural states: Secondary | ICD-10-CM | POA: Diagnosis not present

## 2017-07-10 DIAGNOSIS — Z09 Encounter for follow-up examination after completed treatment for conditions other than malignant neoplasm: Secondary | ICD-10-CM

## 2017-07-10 NOTE — Progress Notes (Signed)
[facility] Visit  Patient name: Angelica Henry MRN [id number]  Date of birth: [birth year]  CC & HPI:  Angelica Henry is a 26 y.o. Caucasian female presenting today for 1 week incision check.  On 8/20 she came to [facility] for ctx and had SROM  On the way. 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    Pertinent History Reviewed:  Medical & Surgical Hx:   Past Medical History:  Diagnosis Date  . Asthma   . Dyspareunia 04/19/2015  . Herpes simplex virus (HSV) infection   . Vaginal Pap smear, abnormal    Past Surgical History:  Procedure Laterality Date  . cervical dilatation and uterine curettage    . CESAREAN SECTION N/A 07/02/2017   Procedure: CESAREAN SECTION;  Surgeon: [name], MD;  Location: [facility] BIRTHING SUITES;  Service: Obstetrics;  Laterality: N/A;  . DILATION AND EVACUATION N/A 08/02/2016   Procedure: CERVICAL DILATATION WITH SUCTION AND SHARP UTERINE CURETTAGE;  Surgeon: Lazaro Arms, MD;  Location: [facility];  Service: Gynecology;  Laterality: N/A;   Family History  Problem Relation Age of Onset  . Cancer Mother        cervical  . Heart disease Father   . Other Sister        cyst on ovary  . Glaucoma Maternal Grandmother   . Heart disease Maternal Grandmother   . Cirrhosis Maternal Grandfather   . Cancer Maternal Grandfather   . Other Paternal Grandfather        brain aneursym    Current Outpatient Prescriptions:  .  ibuprofen (ADVIL,MOTRIN) 600 MG tablet, Take 1 tablet (600 mg total) by mouth every 6 (six) hours., Disp: 30 tablet, Rfl: 0 .  oxyCODONE-acetaminophen (PERCOCET/ROXICET) 5-325 MG tablet, Take 2 tablets by mouth every 4 (four) hours as needed (pain scale > 7)., Disp: 30 tablet, Rfl: 0 .  Prenatal Multivit-Min-Fe-FA (PRENATAL VITAMINS) 0.8 MG tablet, Take 1 tablet by mouth daily., Disp: 30 tablet, Rfl: 12 Social History: Reviewed -  reports that she has  quit smoking. Her smoking use included Cigarettes. She quit after 11.00 years of use. She has never used smokeless tobacco.  Review of Systems:   Constitutional: Negative for fever and chills Eyes: Negative for visual disturbances Respiratory: Negative for shortness of breath, dyspnea Cardiovascular: Negative for chest pain or palpitations  Gastrointestinal: Negative for vomiting, diarrhea and constipation; Genitourinary: Negative for dysuria and urgency, vaginal irritation or itching Musculoskeletal: Negative for back pain, joint pain, myalgias  Neurological: Negative for dizziness and headaches    Objective Findings:    Physical Examination: General appearance - well appearing, and in no distress Mental status - alert, oriented to person, place, and time Chest:  Normal respiratory effort Heart - normal rate and regular rhythm Abdomen:  Soft, appropriately tender, incision looks great Pelvic: deferred Musculoskeletal:  Normal range of motion without pain Extremities:  No edema    No results found for this or any previous visit (from the past 24 hour(s)).    Assessment & Plan:  A:   1 week SP stat cS for NRFHT upon arrival to MAU P:     Return in about 4 weeks (around 08/07/2017) for 4 weeks for postpartum and order and schedule Nexplanon .  CRESENZO-DISHMAN,Athanasia Stanwood CNM 07/10/2017 12:30 PM

## 2017-07-12 ENCOUNTER — Other Ambulatory Visit: Payer: Self-pay | Admitting: Obstetrics and Gynecology

## 2017-07-12 MED ORDER — FERROUS SULFATE 325 (65 FE) MG PO TABS: 325.0000 mg | ORAL_TABLET | Freq: Two times a day (BID) | ORAL | 3 refills | Status: AC

## 2017-07-12 NOTE — Progress Notes (Signed)
[facility] will [facility] ferrous sulfate. bid

## 2017-08-07 ENCOUNTER — Encounter: Payer: Self-pay | Admitting: Advanced Practice Midwife

## 2017-08-07 ENCOUNTER — Ambulatory Visit (INDEPENDENT_AMBULATORY_CARE_PROVIDER_SITE_OTHER): Payer: Medicaid Other | Admitting: Advanced Practice Midwife

## 2017-08-07 NOTE — Progress Notes (Signed)
Angelica Henry is a 26 y.o. who presents for a postpartum visit. She is 4 weeks postpartum following a low cervical transverse Cesarean section. She had labor w/SROM and FHR was in the 90's when she presented to [facility]U.  I have fully reviewed the prenatal and intrapartum course. The delivery was at 39.1 gestational weeks.  Anesthesia: general. Postpartum course has been uneventful. Baby's course has been uneventful. Baby is feeding by breast. Bleeding: no bleeding. Bowel function is normal. Bladder function is normal. Patient is sexually active X1 (withdrawal). Contraception method is none. Postpartum depression screening: negative.   Current Outpatient Prescriptions:  .  Prenatal Multivit-Min-Fe-FA (PRENATAL VITAMINS) 0.8 MG tablet, Take 1 tablet by mouth daily., Disp: 30 tablet, Rfl: 12 .  ferrous sulfate 325 (65 FE) MG tablet, Take 1 tablet (325 mg total) by mouth 2 (two) times daily with a meal. (Patient not taking: Reported on 08/07/2017), Disp: 60 tablet, Rfl: 3 .  ibuprofen (ADVIL,MOTRIN) 600 MG tablet, Take 1 tablet (600 mg total) by mouth every 6 (six) hours. (Patient not taking: Reported on 08/07/2017), Disp: 30 tablet, Rfl: 0  Review of Systems   Constitutional: Negative for fever and chills Eyes: Negative for visual disturbances Respiratory: Negative for shortness of breath, dyspnea Cardiovascular: Negative for chest pain or palpitations  Gastrointestinal: Negative for vomiting, diarrhea and constipation Genitourinary: Negative for dysuria and urgency Musculoskeletal: Negative for back pain, joint pain, myalgias  Neurological: Negative for dizziness and headaches    Objective:     Vitals:   08/07/17 1420  BP: 122/84  Pulse: 66   General:  alert, cooperative and no distress   Breasts:  negative  Lungs: clear to auscultation bilaterally  Heart:  regular rate and rhythm  Abdomen: Soft, nontender, incision well healed   Vulva:  normal  Vagina: normal vagina  Cervix:  closed   Corpus: Well involuted     Rectal Exam: no hemorrhoids        Assessment:    normal postpartum exam.  Plan:   1. Contraception: Nexplanon 2. Follow up in:  Tomorrow for Nexplanon.(OK to insert)

## 2017-08-08 ENCOUNTER — Ambulatory Visit (INDEPENDENT_AMBULATORY_CARE_PROVIDER_SITE_OTHER): Payer: Medicaid Other | Admitting: Advanced Practice Midwife

## 2017-08-08 ENCOUNTER — Encounter: Payer: Self-pay | Admitting: Advanced Practice Midwife

## 2017-08-08 ENCOUNTER — Telehealth: Payer: Self-pay | Admitting: Advanced Practice Midwife

## 2017-08-08 VITALS — BP 130/70 | HR 68 | Wt 206.0 lb

## 2017-08-08 DIAGNOSIS — Z3049 Encounter for surveillance of other contraceptives: Secondary | ICD-10-CM

## 2017-08-08 DIAGNOSIS — Z30017 Encounter for initial prescription of implantable subdermal contraceptive: Secondary | ICD-10-CM | POA: Insufficient documentation

## 2017-08-08 DIAGNOSIS — Z3202 Encounter for pregnancy test, result negative: Secondary | ICD-10-CM

## 2017-08-08 LAB — POCT URINE PREGNANCY: Preg Test, Ur: NEGATIVE

## 2017-08-08 MED ORDER — ETONOGESTREL 68 MG ~~LOC~~ IMPL
68.0000 mg | DRUG_IMPLANT | Freq: Once | SUBCUTANEOUS | Status: AC
  Administered 2017-08-08: 68 mg via SUBCUTANEOUS

## 2017-08-08 NOTE — Telephone Encounter (Signed)
I sent her a message but if you want to put a reminder in the system she will need it July 2019

## 2017-08-08 NOTE — Progress Notes (Signed)
**Note Angelica via Obfuscation**   HPI:  Angelica Henry is a 26 y.o. year old Caucasian female here for Nexplanon insertion.  She is breastfeeding, and her pregnancy test today was negative.  Risks/benefits/side effects of Nexplanon have been discussed and her questions have been answered.  Specifically, a failure rate of 11/998 has been reported, with an increased failure rate if pt takes St. John's Wort and/or antiseizure medicaitons.  Angelica Henry is aware of the common side effect of irregular bleeding, which the incidence of decreases over time.   Past Medical History: Past Medical History:  Diagnosis Date  . Asthma   . Dyspareunia 04/19/2015  . Herpes simplex virus (HSV) infection   . Vaginal Pap smear, abnormal     Past Surgical History: Past Surgical History:  Procedure Laterality Date  . cervical dilatation and uterine curettage    . CESAREAN SECTION N/A 07/02/2017   Procedure: CESAREAN SECTION;  Surgeon: [name], MD;  Location: [facility]IR[facility] SUIT[facility];  Service: Obstetrics;  Laterality: N/A;  . DILATION AND EVACUATION N/A 08/02/2016   Procedure: CERVICAL DILATATION WITH SUCTION AND SHARP UTERINE CURETTAGE;  Surgeon: Lazaro Arms, MD;  Location: [facility];  Service: Gynecology;  Laterality: N/A;    Family History: Family History  Problem Relation Age of Onset  . Cancer Mother        cervical  . Heart disease Father   . Other Sister        cyst on ovary  . Glaucoma Maternal Grandmother   . Heart disease Maternal Grandmother   . Cirrhosis Maternal Grandfather   . Cancer Maternal Grandfather   . Other Paternal Grandfather        brain aneursym    Social History: Social History  Substance Use Topics  . Smoking status: Former Smoker    Years: 11.00    Types: Cigarettes  . Smokeless tobacco: Never Used     Comment: smokes 1 cig daily  . Alcohol use No    Allergies:  Allergies  Allergen Reactions  . Penicillins Other (See Comments)    Flushing Has patient had a PCN reaction causing immediate  rash, facial/tongue/throat swelling, SOB or lightheadedness with hypotension: No Has patient had a PCN reaction causing severe rash involving mucus membranes or skin necrosis: No Has patient had a PCN reaction that required hospitalization: No Has patient had a PCN reaction occurring within the last 10 years: No If all of the above answers are "NO", then may proceed with Cephalosporin use.       Her left arm, approximatly 4 inches proximal from the elbow, was cleansed with alcohol and anesthetized with 2cc of 2% Lidocaine.  The area was cleansed again and the Nexplanon was inserted without difficulty.  A pressure bandage was applied.  Pt was instructed to remove pressure bandage in a few hours, and keep insertion site covered with a bandaid for 3 days.  Back up contraception was recommended for 2 weeks.  Follow-up scheduled PRN problems  CRESENZO-DISHMAN,Johari Bennetts 08/08/2017 2:16 PM
